# Patient Record
Sex: Female | Born: 1960 | Race: White | Hispanic: No | Marital: Married | Smoking: Former smoker
Health system: Southern US, Community
[De-identification: ages and names within clinical notes are randomized; demographics above are authoritative.]

## PROBLEM LIST (undated history)

## (undated) DIAGNOSIS — E785 Hyperlipidemia, unspecified: Secondary | ICD-10-CM

## (undated) DIAGNOSIS — Z8739 Personal history of other diseases of the musculoskeletal system and connective tissue: Secondary | ICD-10-CM

## (undated) DIAGNOSIS — M797 Fibromyalgia: Secondary | ICD-10-CM

## (undated) DIAGNOSIS — F419 Anxiety disorder, unspecified: Secondary | ICD-10-CM

## (undated) DIAGNOSIS — G4730 Sleep apnea, unspecified: Secondary | ICD-10-CM

## (undated) DIAGNOSIS — Z87442 Personal history of urinary calculi: Secondary | ICD-10-CM

## (undated) HISTORY — PX: SKIN GRAFT: SHX250

## (undated) HISTORY — PX: KNEE SURGERY: SHX244

## (undated) HISTORY — PX: BREAST REDUCTION SURGERY: SHX8

## (undated) HISTORY — DX: Hyperlipidemia, unspecified: E78.5

## (undated) HISTORY — PX: ABDOMINAL HYSTERECTOMY: SHX81

## (undated) HISTORY — PX: TONSILLECTOMY: SUR1361

## (undated) HISTORY — DX: Fibromyalgia: M79.7

---

## 2006-05-23 ENCOUNTER — Ambulatory Visit: Payer: Self-pay | Admitting: Cardiology

## 2015-08-30 DIAGNOSIS — Z720 Tobacco use: Secondary | ICD-10-CM | POA: Diagnosis not present

## 2015-08-30 DIAGNOSIS — F329 Major depressive disorder, single episode, unspecified: Secondary | ICD-10-CM | POA: Diagnosis not present

## 2015-08-30 DIAGNOSIS — L821 Other seborrheic keratosis: Secondary | ICD-10-CM | POA: Diagnosis not present

## 2015-08-30 DIAGNOSIS — L820 Inflamed seborrheic keratosis: Secondary | ICD-10-CM | POA: Diagnosis not present

## 2015-08-30 DIAGNOSIS — M545 Low back pain: Secondary | ICD-10-CM | POA: Diagnosis not present

## 2015-08-30 DIAGNOSIS — Z6822 Body mass index (BMI) 22.0-22.9, adult: Secondary | ICD-10-CM | POA: Diagnosis not present

## 2015-08-30 DIAGNOSIS — K219 Gastro-esophageal reflux disease without esophagitis: Secondary | ICD-10-CM | POA: Diagnosis not present

## 2015-10-30 DIAGNOSIS — G8929 Other chronic pain: Secondary | ICD-10-CM | POA: Diagnosis not present

## 2015-10-30 DIAGNOSIS — E7800 Pure hypercholesterolemia, unspecified: Secondary | ICD-10-CM | POA: Diagnosis not present

## 2015-11-30 DIAGNOSIS — M159 Polyosteoarthritis, unspecified: Secondary | ICD-10-CM | POA: Diagnosis not present

## 2015-11-30 DIAGNOSIS — E7800 Pure hypercholesterolemia, unspecified: Secondary | ICD-10-CM | POA: Diagnosis not present

## 2015-12-20 DIAGNOSIS — E7800 Pure hypercholesterolemia, unspecified: Secondary | ICD-10-CM | POA: Diagnosis not present

## 2015-12-20 DIAGNOSIS — M159 Polyosteoarthritis, unspecified: Secondary | ICD-10-CM | POA: Diagnosis not present

## 2015-12-29 DIAGNOSIS — M797 Fibromyalgia: Secondary | ICD-10-CM | POA: Diagnosis not present

## 2016-01-17 DIAGNOSIS — D3132 Benign neoplasm of left choroid: Secondary | ICD-10-CM | POA: Diagnosis not present

## 2016-01-17 DIAGNOSIS — H524 Presbyopia: Secondary | ICD-10-CM | POA: Diagnosis not present

## 2016-01-25 DIAGNOSIS — L820 Inflamed seborrheic keratosis: Secondary | ICD-10-CM | POA: Diagnosis not present

## 2016-01-25 DIAGNOSIS — Z1283 Encounter for screening for malignant neoplasm of skin: Secondary | ICD-10-CM | POA: Diagnosis not present

## 2016-01-25 DIAGNOSIS — L723 Sebaceous cyst: Secondary | ICD-10-CM | POA: Diagnosis not present

## 2016-01-25 DIAGNOSIS — X32XXXA Exposure to sunlight, initial encounter: Secondary | ICD-10-CM | POA: Diagnosis not present

## 2016-01-25 DIAGNOSIS — L570 Actinic keratosis: Secondary | ICD-10-CM | POA: Diagnosis not present

## 2016-01-29 DIAGNOSIS — F329 Major depressive disorder, single episode, unspecified: Secondary | ICD-10-CM | POA: Diagnosis not present

## 2016-01-29 DIAGNOSIS — Z1389 Encounter for screening for other disorder: Secondary | ICD-10-CM | POA: Diagnosis not present

## 2016-01-29 DIAGNOSIS — Z79899 Other long term (current) drug therapy: Secondary | ICD-10-CM | POA: Diagnosis not present

## 2016-01-29 DIAGNOSIS — Z6823 Body mass index (BMI) 23.0-23.9, adult: Secondary | ICD-10-CM | POA: Diagnosis not present

## 2016-01-29 DIAGNOSIS — E7800 Pure hypercholesterolemia, unspecified: Secondary | ICD-10-CM | POA: Diagnosis not present

## 2016-01-29 DIAGNOSIS — Z1211 Encounter for screening for malignant neoplasm of colon: Secondary | ICD-10-CM | POA: Diagnosis not present

## 2016-01-29 DIAGNOSIS — Z0000 Encounter for general adult medical examination without abnormal findings: Secondary | ICD-10-CM | POA: Diagnosis not present

## 2016-01-29 DIAGNOSIS — R5383 Other fatigue: Secondary | ICD-10-CM | POA: Diagnosis not present

## 2016-01-29 DIAGNOSIS — E559 Vitamin D deficiency, unspecified: Secondary | ICD-10-CM | POA: Diagnosis not present

## 2016-01-29 DIAGNOSIS — Z299 Encounter for prophylactic measures, unspecified: Secondary | ICD-10-CM | POA: Diagnosis not present

## 2016-01-29 DIAGNOSIS — Z7189 Other specified counseling: Secondary | ICD-10-CM | POA: Diagnosis not present

## 2016-02-02 DIAGNOSIS — H5032 Intermittent alternating esotropia: Secondary | ICD-10-CM | POA: Diagnosis not present

## 2016-02-09 DIAGNOSIS — M159 Polyosteoarthritis, unspecified: Secondary | ICD-10-CM | POA: Diagnosis not present

## 2016-02-09 DIAGNOSIS — E7800 Pure hypercholesterolemia, unspecified: Secondary | ICD-10-CM | POA: Diagnosis not present

## 2016-03-21 DIAGNOSIS — M159 Polyosteoarthritis, unspecified: Secondary | ICD-10-CM | POA: Diagnosis not present

## 2016-03-21 DIAGNOSIS — E7800 Pure hypercholesterolemia, unspecified: Secondary | ICD-10-CM | POA: Diagnosis not present

## 2016-03-25 DIAGNOSIS — H00024 Hordeolum internum left upper eyelid: Secondary | ICD-10-CM | POA: Diagnosis not present

## 2016-03-29 DIAGNOSIS — M797 Fibromyalgia: Secondary | ICD-10-CM | POA: Diagnosis not present

## 2016-03-29 DIAGNOSIS — F329 Major depressive disorder, single episode, unspecified: Secondary | ICD-10-CM | POA: Diagnosis not present

## 2016-03-29 DIAGNOSIS — E7800 Pure hypercholesterolemia, unspecified: Secondary | ICD-10-CM | POA: Diagnosis not present

## 2016-03-29 DIAGNOSIS — R42 Dizziness and giddiness: Secondary | ICD-10-CM | POA: Diagnosis not present

## 2016-04-03 DIAGNOSIS — G9389 Other specified disorders of brain: Secondary | ICD-10-CM | POA: Diagnosis not present

## 2016-04-03 DIAGNOSIS — R42 Dizziness and giddiness: Secondary | ICD-10-CM | POA: Diagnosis not present

## 2016-04-11 DIAGNOSIS — R195 Other fecal abnormalities: Secondary | ICD-10-CM | POA: Diagnosis not present

## 2016-04-16 DIAGNOSIS — K635 Polyp of colon: Secondary | ICD-10-CM | POA: Diagnosis not present

## 2016-04-16 DIAGNOSIS — Z8249 Family history of ischemic heart disease and other diseases of the circulatory system: Secondary | ICD-10-CM | POA: Diagnosis not present

## 2016-04-16 DIAGNOSIS — K219 Gastro-esophageal reflux disease without esophagitis: Secondary | ICD-10-CM | POA: Diagnosis not present

## 2016-04-16 DIAGNOSIS — R195 Other fecal abnormalities: Secondary | ICD-10-CM | POA: Diagnosis not present

## 2016-04-16 DIAGNOSIS — Z79899 Other long term (current) drug therapy: Secondary | ICD-10-CM | POA: Diagnosis not present

## 2016-04-16 DIAGNOSIS — E7800 Pure hypercholesterolemia, unspecified: Secondary | ICD-10-CM | POA: Diagnosis not present

## 2016-04-16 DIAGNOSIS — D120 Benign neoplasm of cecum: Secondary | ICD-10-CM | POA: Diagnosis not present

## 2016-04-16 DIAGNOSIS — M797 Fibromyalgia: Secondary | ICD-10-CM | POA: Diagnosis not present

## 2016-04-16 DIAGNOSIS — D125 Benign neoplasm of sigmoid colon: Secondary | ICD-10-CM | POA: Diagnosis not present

## 2016-04-16 DIAGNOSIS — Z833 Family history of diabetes mellitus: Secondary | ICD-10-CM | POA: Diagnosis not present

## 2016-04-16 DIAGNOSIS — Z807 Family history of other malignant neoplasms of lymphoid, hematopoietic and related tissues: Secondary | ICD-10-CM | POA: Diagnosis not present

## 2016-04-18 DIAGNOSIS — E7800 Pure hypercholesterolemia, unspecified: Secondary | ICD-10-CM | POA: Diagnosis not present

## 2016-04-18 DIAGNOSIS — M159 Polyosteoarthritis, unspecified: Secondary | ICD-10-CM | POA: Diagnosis not present

## 2016-04-26 DIAGNOSIS — Z6823 Body mass index (BMI) 23.0-23.9, adult: Secondary | ICD-10-CM | POA: Diagnosis not present

## 2016-04-26 DIAGNOSIS — E7800 Pure hypercholesterolemia, unspecified: Secondary | ICD-10-CM | POA: Diagnosis not present

## 2016-04-26 DIAGNOSIS — F329 Major depressive disorder, single episode, unspecified: Secondary | ICD-10-CM | POA: Diagnosis not present

## 2016-04-26 DIAGNOSIS — Z90710 Acquired absence of both cervix and uterus: Secondary | ICD-10-CM | POA: Diagnosis not present

## 2016-04-26 DIAGNOSIS — M797 Fibromyalgia: Secondary | ICD-10-CM | POA: Diagnosis not present

## 2016-04-30 DIAGNOSIS — K219 Gastro-esophageal reflux disease without esophagitis: Secondary | ICD-10-CM | POA: Diagnosis not present

## 2016-04-30 DIAGNOSIS — M797 Fibromyalgia: Secondary | ICD-10-CM | POA: Diagnosis not present

## 2016-04-30 DIAGNOSIS — Z9049 Acquired absence of other specified parts of digestive tract: Secondary | ICD-10-CM | POA: Diagnosis not present

## 2016-04-30 DIAGNOSIS — R51 Headache: Secondary | ICD-10-CM | POA: Diagnosis not present

## 2016-04-30 DIAGNOSIS — Z79891 Long term (current) use of opiate analgesic: Secondary | ICD-10-CM | POA: Diagnosis not present

## 2016-04-30 DIAGNOSIS — Z90710 Acquired absence of both cervix and uterus: Secondary | ICD-10-CM | POA: Diagnosis not present

## 2016-04-30 DIAGNOSIS — Z0389 Encounter for observation for other suspected diseases and conditions ruled out: Secondary | ICD-10-CM | POA: Diagnosis not present

## 2016-04-30 DIAGNOSIS — Z9889 Other specified postprocedural states: Secondary | ICD-10-CM | POA: Diagnosis not present

## 2016-04-30 DIAGNOSIS — R112 Nausea with vomiting, unspecified: Secondary | ICD-10-CM | POA: Diagnosis not present

## 2016-04-30 DIAGNOSIS — Z79899 Other long term (current) drug therapy: Secondary | ICD-10-CM | POA: Diagnosis not present

## 2016-05-28 ENCOUNTER — Other Ambulatory Visit (INDEPENDENT_AMBULATORY_CARE_PROVIDER_SITE_OTHER): Payer: Medicare Other

## 2016-05-28 ENCOUNTER — Ambulatory Visit: Payer: Self-pay | Admitting: Neurology

## 2016-05-28 ENCOUNTER — Ambulatory Visit (INDEPENDENT_AMBULATORY_CARE_PROVIDER_SITE_OTHER): Payer: Medicare Other | Admitting: Neurology

## 2016-05-28 ENCOUNTER — Encounter: Payer: Self-pay | Admitting: Neurology

## 2016-05-28 VITALS — BP 130/76 | HR 76 | Ht 64.5 in | Wt 137.0 lb

## 2016-05-28 DIAGNOSIS — E611 Iron deficiency: Secondary | ICD-10-CM | POA: Diagnosis not present

## 2016-05-28 DIAGNOSIS — Z299 Encounter for prophylactic measures, unspecified: Secondary | ICD-10-CM | POA: Diagnosis not present

## 2016-05-28 DIAGNOSIS — R413 Other amnesia: Secondary | ICD-10-CM | POA: Diagnosis not present

## 2016-05-28 DIAGNOSIS — M797 Fibromyalgia: Secondary | ICD-10-CM | POA: Diagnosis not present

## 2016-05-28 DIAGNOSIS — Z6823 Body mass index (BMI) 23.0-23.9, adult: Secondary | ICD-10-CM | POA: Diagnosis not present

## 2016-05-28 DIAGNOSIS — R930 Abnormal findings on diagnostic imaging of skull and head, not elsewhere classified: Secondary | ICD-10-CM | POA: Diagnosis not present

## 2016-05-28 DIAGNOSIS — F329 Major depressive disorder, single episode, unspecified: Secondary | ICD-10-CM | POA: Diagnosis not present

## 2016-05-28 DIAGNOSIS — R51 Headache: Secondary | ICD-10-CM | POA: Diagnosis not present

## 2016-05-28 DIAGNOSIS — R9082 White matter disease, unspecified: Secondary | ICD-10-CM

## 2016-05-28 DIAGNOSIS — R519 Headache, unspecified: Secondary | ICD-10-CM

## 2016-05-28 LAB — VITAMIN B12: Vitamin B-12: 354 pg/mL (ref 211–911)

## 2016-05-28 NOTE — Progress Notes (Addendum)
NEUROLOGY CONSULTATION NOTE  Bethsy Scarantino MRN: [id number] DOB: [birth year]  Referring provider: Dr. Manuella Ghazi Primary care provider: Dr. Manuella Ghazi  Reason for consult:  Evaluate for MS  HISTORY OF PRESENT ILLNESS: Haley Caldwell is a 55 year old right-handed woman with depression, fibromyalgia, IBS, hyperlipidemia and former smoker who presents for multiple sclerosis.  History obtained by patient and PCP note.  She has noted problems with memory and change in personality over the past 2 years.  She has some short term memory problems and needs to mark down reminders to keep appointments and pay bills.  One time, while driving on a familiar route, she briefly became disoriented but was quickly able to re-orient herself.  Otherwise she has not gotten lost while driving on familiar routes.  Her husband had an employee whom she apparently disliked.  When looking over her husband's papers, she saw that person's name and was unable to recall who she was.  She also reports change in personality.  She was always a social person and now has social anxiety.  She has problems with problem-solving skills, which was one of her strong suits.    She feels off balance as well.  She has history of headaches but began having a new headache several months ago.  It is a severe pain behind her left ear.  It lasts about 10 minutes and associated with nausea and vomiting.  It occurs once every 2 weeks.  She denies neck pain.  She also reports episodic horizontal double vision.  It looks like her left eye moves outwards when it occurs.  She was seen by ophthalmology.  I don't have the notes, but it sounds like they may have diagnosed her with amblyopia.  Prisms were ineffective and they suggested surgery to correct it.  Her husband passed away a year ago, but she reports memory problems started 2 years ago.  She denies history of head trauma.  She denies family history of headache or dementia.  Her maternal grandfather had  brain cancer.  To further evaluate, she had an MRI of the brain without contrast performed on 04/03/16.  I do not have the imaging, but the report revealed a nonspecific 5 mm left occipital periventricular white matter hyperintensity, which they said may be chronic ischemia or MS.  TSH from June was 0.463 and vitamin D was 59.  PAST MEDICAL HISTORY: Past Medical History:  Diagnosis Date  . Fibromyalgia   . Hyperlipemia     PAST SURGICAL HISTORY: Past Surgical History:  Procedure Laterality Date  . BREAST REDUCTION SURGERY    . TONSILLECTOMY      MEDICATIONS: No current outpatient prescriptions on file prior to visit.   No current facility-administered medications on file prior to visit.     ALLERGIES: No Known Allergies  FAMILY HISTORY: Family History  Problem Relation Age of Onset  . Brain cancer Paternal Grandfather     SOCIAL HISTORY: Social History   Social History  . Marital status: Unknown    Spouse name: N/A  . Number of children: N/A  . Years of education: N/A   Occupational History  . Not on file.   Social History Main Topics  . Smoking status: Former Research scientist (life sciences)  . Smokeless tobacco: Not on file  . Alcohol use Not on file  . Drug use: Unknown  . Sexual activity: Not on file   Other Topics Concern  . Not on file   Social History Narrative  . No narrative on file  REVIEW OF SYSTEMS: Constitutional: No fevers, chills, or sweats, no generalized fatigue, change in appetite Eyes: No visual changes, double vision, eye pain Ear, nose and throat: No hearing loss, ear pain, nasal congestion, sore throat Cardiovascular: No chest pain, palpitations Respiratory:  No shortness of breath at rest or with exertion, wheezes GastrointestinaI: No nausea, vomiting, diarrhea, abdominal pain, fecal incontinence Genitourinary:  No dysuria, urinary retention or frequency Musculoskeletal:  No neck pain, back pain Integumentary: No rash, pruritus, skin  lesions Neurological: as above Psychiatric: No depression, insomnia, anxiety Endocrine: No palpitations, fatigue, diaphoresis, mood swings, change in appetite, change in weight, increased thirst Hematologic/Lymphatic:  No purpura, petechiae. Allergic/Immunologic: no itchy/runny eyes, nasal congestion, recent allergic reactions, rashes  PHYSICAL EXAM: Vitals:   05/28/16 0750  BP: 130/76  Pulse: 76   General: No acute distress.  Patient appears well-groomed.  Head:  Normocephalic/atraumatic Eyes:  fundi examined but not visualized Neck: supple, no paraspinal tenderness, full range of motion Back: No paraspinal tenderness Heart: regular rate and rhythm Lungs: Clear to auscultation bilaterally. Vascular: No carotid bruits. Neurological Exam: Mental status: alert and oriented to person, place, and time, recent and remote memory intact, fund of knowledge intact, attention and concentration intact, speech fluent and not dysarthric, language intact. MMSE - Mini Mental State Exam 05/28/2016  Orientation to time 5  Orientation to Place 5  Registration 3  Attention/ Calculation 5  Recall 2  Language- name 2 objects 2  Language- repeat 1  Language- follow 3 step command 3  Language- read & follow direction 1  Write a sentence 1  Copy design 1  Total score 29   Cranial nerves: CN I: not tested CN II: pupils equal, round and reactive to light, visual fields intact CN III, IV, VI:  full range of motion, no nystagmus, no ptosis CN V: facial sensation intact CN VII: upper and lower face symmetric CN VIII: hearing intact CN IX, X: gag intact, uvula midline CN XI: sternocleidomastoid and trapezius muscles intact CN XII: tongue midline Bulk & Tone: normal, no fasciculations. Motor:  5/5 throughout  Sensation: temperature and vibration sensation intact. Deep Tendon Reflexes:  2+ throughout, toes downgoing.  Finger to nose testing:  Without dysmetria.  Heel to shin:  Without dysmetria.   Gait:  Normal station and stride.  Able to turn and tandem walk. Romberg negative.  IMPRESSION: 1.  Abnormal white matter change in MRI of brain.  It is of uncertain clinical significance.  It may represent small area of chronic ischemia in someone with hyperlipidemia and history of smoking.  Based on report, it is a nonspecific finding and in itself is not diagnostic for multiple sclerosis.   2.  Intermittent diplopia.  It is being treated by ophthalmology.  There is nothing on brain MRI to explain these symptoms. 3.  Memory deficits and personality change.   4.  Headache.   PLAN: 1.  I asked her to bring the MRI of the brain for me to personally review. 2.  We will check B12 level.  If unremarkable, will get neuropsychological testing 3.  She is not interested in starting headache preventative at this time. 4.  Follow up after testing.  Thank you for allowing me to take part in the care of this patient.  Metta Clines, DO  CC:  Monico Blitz, MD

## 2016-05-28 NOTE — Patient Instructions (Signed)
Based on description of MRI, it is not diagnostic for MS.  Sometimes, people may have a spot or two on the brain of unknown clinical significance. 1.  For memory, we will check B12.  If it is very low, then we will treat (low B12 may cause memory problems).  If it is unremarkable, we will set you up for neuropsychological testing.  Follow up afterwards. 2.  Try to bring me the disc of your brain MRI so I can review it myself.

## 2016-05-29 NOTE — Progress Notes (Signed)
Chart forwarded.  

## 2016-05-30 ENCOUNTER — Telehealth: Payer: Self-pay

## 2016-05-30 NOTE — Telephone Encounter (Signed)
Message sent to scheduling staff to contact pt to schedule.

## 2016-05-30 NOTE — Telephone Encounter (Signed)
-----   Message from Pieter Partridge, DO sent at 05/30/2016  7:07 AM EDT ----- B12 looks okay.  I would like to get neuropsychological testing

## 2016-05-30 NOTE — Telephone Encounter (Signed)
Message relayed to patient. Verbalized understanding and denied questions.   

## 2016-06-12 DIAGNOSIS — M159 Polyosteoarthritis, unspecified: Secondary | ICD-10-CM | POA: Diagnosis not present

## 2016-06-12 DIAGNOSIS — E7800 Pure hypercholesterolemia, unspecified: Secondary | ICD-10-CM | POA: Diagnosis not present

## 2016-06-24 DIAGNOSIS — Z713 Dietary counseling and surveillance: Secondary | ICD-10-CM | POA: Diagnosis not present

## 2016-06-24 DIAGNOSIS — Z299 Encounter for prophylactic measures, unspecified: Secondary | ICD-10-CM | POA: Diagnosis not present

## 2016-06-24 DIAGNOSIS — G8929 Other chronic pain: Secondary | ICD-10-CM | POA: Diagnosis not present

## 2016-06-24 DIAGNOSIS — Z6823 Body mass index (BMI) 23.0-23.9, adult: Secondary | ICD-10-CM | POA: Diagnosis not present

## 2016-07-02 ENCOUNTER — Ambulatory Visit (INDEPENDENT_AMBULATORY_CARE_PROVIDER_SITE_OTHER): Payer: Medicare Other | Admitting: Psychology

## 2016-07-02 ENCOUNTER — Encounter: Payer: Self-pay | Admitting: Psychology

## 2016-07-02 DIAGNOSIS — R413 Other amnesia: Secondary | ICD-10-CM | POA: Diagnosis not present

## 2016-07-02 DIAGNOSIS — R930 Abnormal findings on diagnostic imaging of skull and head, not elsewhere classified: Secondary | ICD-10-CM

## 2016-07-02 DIAGNOSIS — R9082 White matter disease, unspecified: Secondary | ICD-10-CM

## 2016-07-02 NOTE — Progress Notes (Signed)
NEUROPSYCHOLOGICAL INTERVIEW (CPT: [id number])  Name: Haley Caldwell Date of Birth: 16-Dec-1960 Date of Interview: 07/02/2016  Reason for Referral:  Haley Caldwell is a 55 y.o., right-handed, widowed female who is referred for neuropsychological evaluation by Dr. Metta Clines of [facility] due to concerns about cognitive changes. This patient is unaccompanied in the office today.   History of Presenting Problem:  Ms. Haley Caldwell reported cognitive changes over at least the past two years. She is unsure if onset was sudden or gradual, and she is unsure if course is stable or worsening. She reported that her daughter has also noticed memory difficulties in the patient and when she recently had a more significant memory lapse, she encouraged the patient to be seen by a medical provider. Apparently the patient was going through her husband's office after he passed away, and she came across the name and photo of a former employee who she did not remember. She asked her daughter about it and was told that it was someone the patient really did not like. Even with cueing, the patient could not remember the person. The person had worked for her husband a few years ago. Ms. Haley Caldwell reported she has also had poor memory for more remote events, like things from her daughter's childhood. She has also had more trouble remembering recent conversations. Ms. Haley Caldwell reported that she has "lost time" twice recently -- ie, lost track of time and does not know where the time went. She does not get lost when driving but on one occasion she was driving to her daughter's house and she suddenly did not know what road she was on. She kept driving until she saw a landmark she recognized. Ms. Haley Caldwell also reported that at some point, she forgot which bottle in the shower was her shampoo and which was her conditioner, even though the bottles look different. She had to read the bottles each day in order to know; she could not memorize which  was which. She finally got it memorized but then she went away for the weekend and she forgot again. She also noted that she used to be a very good problem-solver but this is now a challenge for her. Upon direct questioning, the patient also reports misplacing/losing items, some forgetfulness for nighttime medications, difficulty concentrating, distractibility, starting but not finishing tasks, slowed information processing speed, word finding difficulty, spelling difficulty, and comprehension difficulty. She has not missed appointments or other obligations, but she notes that she has to put everything in her phone and rely on reminders.  Ms. Haley Caldwell reported depressed mood since her husband's unexpected death a year ago. Over the past year, she has spent most of her time in bed due to her depression. 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 She denied suicidal ideation but has felt that if she died it would be a relief. She has not had any grief counseling; her church offers group grief counseling but she is afraid she would take on the pain of the other participants as she is a very empathic person. She has also had no appetite since her husband died, and she has lost a significant amount of weight. She usually eats one meal a day now. She sleeps pretty well although she does wake up a lot to urinate (she drinks a lot of water). She does not have incontinence. She denies hallucinations. She denies personality change. When asked about social  support, she denied any other support aside from her daughter. She denied any current stressors including financial or family stress.  The patient reported that her memory problems preceded her husband's unexpected death by at least a year, so she does not think his death is related in any way. She did note that about two years ago (so around the time she began noticing cognitive problems), she started  experiencing social anxiety. She cannot identify any contributing factors to onset of this problem. She would feel very nervous in social situations and relied on her husband as a "buffer". She continues to be very nervous in social situations. She stated she does not want to speak because she does not want to seem stupid if she has word finding difficulty.  Previous psychiatric history was denied.  She denied history of head injury, seizure or loss of consciousness.   There is no known family history of dementia.  Ms. Haley Caldwell saw Dr. Tomi Likens for neurologic consultation on 05/28/2016. MMSE was 29/30 on that date. She had a previous MRI of the brain which apparently showed abnormal white matter change, of uncertain clinical significance. Dr. Tomi Likens will be reviewing the films when they become available.  (MRI of the brain without contrast performed on 04/03/16.  Report revealed a nonspecific 5 mm left occipital periventricular white matter hyperintensity, which they said may be chronic ischemia or MS.)   Current Functioning: Ms. Haley Caldwell lives alone in her own condo. 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 She is enjoying this. She also enjoys spending time with her daughter and grandchildren. The patient manages all complex ADLs independently. She had to learn to manage the finances after her husband passed away as he had always taken care of this. She has not had any problems doing this. She does use reminders for bills.  Physically, the patient reported she feels "run down". She has chronic pain associated with fibromyalgia. She also had new onset of headaches prior to seeing Dr. Tomi Likens but these has mostly subsided.    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 3 children, 7 grandchildren. Alcohol/Tobacco/Substances: No alcohol  use. Used to smoke tobacco, haven't smoked in 20 years. Do vape or use e-cig (in last couple of years). No substance abuse.   Medical History: Past Medical History:  Diagnosis Date  . Fibromyalgia   . Hyperlipemia   Degenerative disc disease Nicotine dependence (vape and e-cig) Depression/Bereavement   Current Medications:  Outpatient Encounter Prescriptions as of 07/02/2016  Medication Sig  . acyclovir (ZOVIRAX) 200 MG capsule   . carisoprodol (SOMA) 350 MG tablet Take 350 mg by mouth 4 (four) times daily as needed for muscle spasms.  Marland Kitchen esomeprazole (NEXIUM) 40 MG capsule   . pravastatin (PRAVACHOL) 40 MG tablet    No facility-administered encounter medications on file as of 07/02/2016.    The patient also reported that she takes Hydrocodone 1/2 pill 4x/day (7.5mg ). She reported she has been taking this for about 20 years. She reported she takes the [place] only once a day.  Behavioral Observations:   Appearance: Neat, casually and appropriately dressed and groomed Gait: Ambulated independently, no abnormalities observed Speech: Fluent; normal rate, rhythm and volume Thought process: Linear, goal directed Affect: Blunted Interpersonal: Pleasant, appropriate   TESTING: There is medical necessity to proceed with neuropsychological assessment as the results will be used to aid  in differential diagnosis and clinical decision-making and to inform specific treatment recommendations. Per the patient and medical records reviewed, there has been a change in cognitive functioning and a reasonable suspicion of neurocognitive disorder.   PLAN: The patient will return for a full battery of neuropsychological testing with a psychometrician under my supervision. Education regarding testing procedures was provided. Subsequently, the patient will see this provider for a follow-up session at which time her test performances and my impressions and treatment recommendations will be reviewed in  detail.   Full neuropsychological evaluation report to follow.

## 2016-07-09 ENCOUNTER — Ambulatory Visit (INDEPENDENT_AMBULATORY_CARE_PROVIDER_SITE_OTHER): Payer: Medicare Other | Admitting: Psychology

## 2016-07-09 DIAGNOSIS — R413 Other amnesia: Secondary | ICD-10-CM | POA: Diagnosis not present

## 2016-07-09 DIAGNOSIS — R9082 White matter disease, unspecified: Secondary | ICD-10-CM

## 2016-07-09 DIAGNOSIS — R930 Abnormal findings on diagnostic imaging of skull and head, not elsewhere classified: Secondary | ICD-10-CM

## 2016-07-09 NOTE — Progress Notes (Signed)
   Neuropsychology Note  Haley Caldwell returned today for 2 hours of neuropsychological testing with technician, Milana Kidney, BS, under the supervision of Dr. Macarthur Critchley. The patient did not appear overtly distressed by the testing session, per behavioral observation or via self-report to the technician. Rest breaks were offered. Haley Caldwell will return within 2 weeks for a feedback session with Dr. Si Raider at which time her test performances, clinical impressions and treatment recommendations will be reviewed in detail. The patient understands she can contact our office should she require our assistance before this time.  Full report to follow.

## 2016-07-19 DIAGNOSIS — E7800 Pure hypercholesterolemia, unspecified: Secondary | ICD-10-CM | POA: Diagnosis not present

## 2016-07-19 DIAGNOSIS — M159 Polyosteoarthritis, unspecified: Secondary | ICD-10-CM | POA: Diagnosis not present

## 2016-07-20 NOTE — Progress Notes (Signed)
NEUROPSYCHOLOGICAL EVALUATION   Name:    Haley Caldwell  Date of Birth:   April 12, 1961 Date of Interview:  07/02/2016 Date of Testing:  07/09/2016   Date of Feedback:  07/22/2016      Background Information:  Reason for Referral:  Haley Caldwell is a 55 y.o. female referred by Dr. Metta Clines to assess her current level of cognitive functioning and assist in differential diagnosis. The current evaluation consisted of a review of available medical records, an interview with the patient, and the completion of a neuropsychological testing battery. Informed consent was obtained.  History of Presenting Problem:  Haley Caldwell reported cognitive changes over at least the past two years. She is unsure if onset was sudden or gradual, and she is unsure if course is stable or worsening. She reported that her daughter has also noticed memory difficulties in the patient and when she recently had a more significant memory lapse, she encouraged the patient to be seen by a medical provider. Apparently the patient was going through her husband's office after he passed away, and she came across the name and photo of a former employee who she did not remember. She asked her daughter about it and was told that it was someone the patient really did not like. Even with cueing, the patient could not remember the person. The person had worked for her husband a few years ago. Haley Caldwell reported she has also had poor memory for more remote events, like things from her daughter's childhood. She has also had more trouble remembering recent conversations. Haley Caldwell reported that she has "lost time" twice recently -- ie, lost track of time and does not know where the time went. She does not get lost when driving but on one occasion she was driving to her daughter's house and she suddenly did not know what road she was on. She kept driving until she saw a landmark she recognized. Haley Caldwell also reported that at some point, she  forgot which bottle in the shower was her shampoo and which was her conditioner, even though the bottles look different. She had to read the bottles each day in order to know; she could not memorize which was which. She finally got it memorized but then she went away for the weekend and she forgot again. She also noted that she used to be a very good problem-solver but this is now a challenge for her. Upon direct questioning, the patient also reports misplacing/losing items, some forgetfulness for nighttime medications, difficulty concentrating, distractibility, starting but not finishing tasks, slowed information processing speed, word finding difficulty, spelling difficulty, and comprehension difficulty. She has not missed appointments or other obligations, but she notes that she has to put everything in her phone and rely on reminders.  Haley Caldwell reported depressed mood since her husband's unexpected death a year ago. Over the past year, she has spent most of her time in bed due to her depression. 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 She denied suicidal ideation but has felt that if she died it would be a relief. She has not had any grief counseling; her church offers group grief counseling but she is afraid she would take on the pain of the other participants as she is a very empathic person. She has also had no appetite since her husband died, and she has lost a significant amount of weight. She  usually eats one meal a day now. She sleeps pretty well although she does wake up a lot to urinate (she drinks a lot of water). She does not have incontinence. She denies hallucinations. She denies personality change. When asked about social support, she denied any other support aside from her daughter. She denied any current stressors including financial or family stress.  The patient reported that her memory problems preceded her husband's  unexpected death by at least a year, so she does not think his death is related in any way. She did note that about two years ago (so around the time she began noticing cognitive problems), she started experiencing social anxiety. She cannot identify any contributing factors to onset of this problem. She would feel very nervous in social situations and relied on her husband as a "buffer". She continues to be very nervous in social situations. She stated she does not want to speak because she does not want to seem stupid if she has word finding difficulty.  Previous psychiatric history was denied.  She denied history of head injury, seizure or loss of consciousness.   There is no known family history of dementia.  Haley Caldwell saw Dr. Tomi Likens for neurologic consultation on 05/28/2016. MMSE was 29/30 on that date. She had a previous MRI of the brain which apparently showed abnormal white matter change, of uncertain clinical significance. Dr. Tomi Likens will be reviewing the films when they become available.  (MRI of the brain without contrast performed on 04/03/16. Report revealed a nonspecific 5 mm left occipital periventricular white matter hyperintensity, which they said may be chronic ischemia or MS.)   Current Functioning: Haley Caldwell lives alone in her own condo. 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 She is enjoying this. She also enjoys spending time with her daughter and grandchildren. The patient manages all complex ADLs independently. She had to learn to manage the finances after her husband passed away as he had always taken care of this. She has not had any problems doing this. She does use reminders for bills.  Physically, the patient reported she feels "run down". She has chronic pain associated with fibromyalgia. She also had new onset of headaches prior to seeing Dr. Tomi Likens but these has mostly subsided.    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 3 children, 7 grandchildren. Alcohol/Tobacco/Substances: No alcohol use. Used to smoke tobacco, has not smoked in 20 years. She does vape or use e-cig (in last couple of years). No substance abuse.   Medical History:  Past Medical History:  Diagnosis Date  . Fibromyalgia   . Hyperlipemia   Degenerative disc disease Nicotine dependence (vape and e-cig) Depression/Bereavement  Current medications:  Outpatient Encounter Prescriptions as of 07/22/2016  Medication Sig  . acyclovir (ZOVIRAX) 200 MG capsule   . carisoprodol (SOMA) 350 MG tablet Take 350 mg by mouth 4 (four) times daily as needed for muscle spasms.  Marland Kitchen esomeprazole (NEXIUM) 40 MG capsule   . pravastatin (PRAVACHOL) 40 MG tablet    No facility-administered encounter medications on file as of 07/22/2016.    The patient also reported that she takes Hydrocodone 1/2 pill 4x/day (7.5mg ). She reported she has been taking this for about 20 years. She reported she takes the [place] only once a day.  Current Examination:  Behavioral Observations:   Appearance: Neat, casually and appropriately  dressed and groomed Gait: Ambulated independently, no abnormalities observed Speech: Fluent; normal rate, rhythm and volume Thought process: Linear, goal directed Affect: Blunted Interpersonal: Pleasant, appropriate Orientation: Oriented to all spheres, accurately named the current President and his predecessor  Tests Administered: . Test of Premorbid Functioning (TOPF) . Wechsler Adult Intelligence Scale-Fourth Edition (WAIS-IV): Arithmetic, Symbol Search, Coding and Digit Span subtests . Wechsler Memory Scale-Fourth Edition (WMS-IV) Adult Version (ages 7-69): Logical Memory I, II and Recognition subtests  . [employer] Verbal Learning Test - 2nd Edition (CVLT-2) Short Form . [employer] (WCST) . Repeatable Battery for the Assessment of Neuropsychological Status (RBANS) Form A:  Figure Copy and Figure Recall Subtest . Neuropsychological Assessment Battery (NAB) Language Module, Form 1:  Auditory Comprehension and Naming Subtests . Controlled Oral Word Association Test (COWAT) . Trail Making Test A and B . Inventory of Complicated Grief (ICG) . Personality Assessment Inventory (PAI)  Test Results: Note: Standardized scores are presented only for use by appropriately trained professionals and to allow for any future test-retest comparison. These scores should not be interpreted without consideration of all the information that is contained in the rest of the report. The most recent standardization samples from the test publisher or other sources were used whenever possible to derive standard scores; scores were corrected for age, gender, ethnicity and education when available.   Test Scores:  Test Name Raw Score Standardized Score Descriptor  TOPF 52/70 SS= 109 Average  WAIS-IV Subtests     Arithmetic 18/22 ss= 13 High average  Symbol Search 40/60 ss= 14 Superior  Coding 77/135 ss= 12 High average  Digit Span 39/48 ss= 16 Very superior  WAIS-IV Index Scores     Working Memory  SS= 125 Superior  Processing Speed  SS= 117 High average  WMS-IV Subtests     LM I 28/50 ss= 11 Average  LM II 31/50 ss= 14 Superior  LM II Recognition 29/30 Cum %: >75 Above average  CVLT-II Scores     Trial 1 6/9 Z= -0.5 Average  Trial 4 9/9 Z= 1 High average  Trials 1-4 total 33/36 T= 65 Superior  SD Free Recall 8/9 Z= 0 Average  LD Free Recall 8/9 Z= 0.5 Average  LD Cued Recall 9/9 Z= 0.5 Average  Recognition Discriminability 9/9 hits, 0 false positives Z= 0.5 Average  Forced Choice Recognition 9/9  WNL  WCST     Total Errors 11 T= 55 Average  Perseverative Responses 11 T= 47 Average  Perseverative Errors 8 T= 49 Average  Conceptual Level Responses 50 T= 51 Average    Categories Completed 4 >16% WNL  Trials to Complete 1st Category 11 >16% WNL  Failure to Maintain Set 1    RBANS Subtest     Figure Copy 18/20 Z= -0.1 Average  Figure Recall 20/20 Z= 2 Very superior  NAB Language Subtests     Auditory Comprehension 89/89 T= 58 High average  Naming 31/31 T= 55 Average  COWAT-FAS 50 T= 59 High average  COWAT-Animals 25 T= 62 High average  Trail Making Test A  19" 0 errors T= 65 Superior  Trail Making Test B  42" 0 errors T= 65 Superior  ICG 28/76  Above cutoff  PAI  Only elevated clinical scale is shown here:                                  DEP  T=  53      Description of Test Results:  The patient performed well on embedded measures of effort/motivation, as well as on a test of memory malingering. As such, the following test performances are considered to be a relatively accurate reflection of the patient's current neurocogitive functioning.   Premorbid verbal intellectual abilities were estimated to have been within the average range based on a test of word reading. Psychomotor processing speed was high average. Auditory attention and working memory were superior. Visual-spatial construction was average. Language abilities were intact. Specifically, confrontation naming was average with 100% accuracy, and semantic verbal fluency was high average. Auditory comprehension was high average as well. With regard to verbal memory, encoding and acquisition of non-contextual information (i.e., word list) was superior across four learning trials. After a brief distracter task, free recall was average. After a delay, free recall was average. Cued recall was average. Performance on a yes/no recognition task was average with 100% accuracy. On another verbal memory test, encoding and acquisition of contextual auditory information (i.e., short stories) was average. After a 25 minute delay, free recall was superior. Performance on a yes/no recognition task was above  average. With regard to non-verbal memory, delayed free recall of visual information was very superior. Executive functioning was intact overall. Mental flexibility and set-shifting were superior on Trails B. Verbal fluency with phonemic search restrictions was high average. Deductive reasoning and problem solving skills were average.   On a self-report questionnaire assessing symptoms of bereavement, the patient's responses were indicative of what is considered "complicated grief" at the present time. Symptoms endorsed included: inability to accept the death of her husband, feeling that her life is now empty, longing for her deceased husband and a great deal of loneliness.   On an extensive measure of psychopathology and personality (PAI), the patient's responses yielded a valid profile but possible positive impression management was noted. The pattern of her responses suggests that she tends to portray herself as being relatively free of common shortcomings to which most individuals will admit, and she appears somewhat reluctant to recognize minor faults in herself. Although there is no evidence to suggest an effort to intentionally distort the profile, the results of the PAI may underrepresent the extent and degree of any significant findings in certain areas due to the client's tendency to avoid negative or unpleasant aspects of herself.  With respect to negative impression management, there is no evidence to suggest that the patient was motivated to portray herself in a more negative or pathological light than the clinical picture would warrant.  On the PAI, the patient reported a number of difficulties consistent with a significant depressive experience.  The quality of the patient's depression seems primarily marked by physiological features, such as a disturbance in sleep pattern, a decrease in level of energy and sexual interest, and a loss of appetite and/or weight.  However, she does not appear to  be reporting a significant degree of dysphoria or thoughts of worthlessness and hopelessness.  This pattern suggests that she might not recognize the aforementioned symptoms as signs of dysphoria and stress or may be repressing the experience of unhappiness to some extent. The patient also indicates some concerns about physical functioning and health matters in general.  She reports particular problems in physical functioning associated with sensory or motor dysfunction.  In psychiatric populations, such symptoms are often associated with conversion disorders, although they may be a result of numerous neurological conditions as well. According to the patient's self-report,  she describes NO significant problems in the following areas: unusual thoughts or peculiar experiences; antisocial behavior; problems with empathy; undue suspiciousness or hostility; extreme moodiness and impulsivity; unusually elevated mood or heightened activity; marked anxiety; problematic behaviors used to manage anxiety.  Also, she reports NO significant problems with alcohol or drug abuse or dependence.  With respect to suicidal ideation, the patient is NOT reporting distress from thoughts of self-harm.   Clinical Impressions: Major depressive disorder, single episode, mild to moderate.  Results of cognitive testing were entirely within normal limits. There were no areas of impairment, and all performances were at least average, with many scores in the high average to superior range. Meanwhile, there is evidence of significant depression that extends beyond what is considered normal bereavement. As such, the patient's subjective cognitive complaints are most likely secondary to depression and grief. I understand that the patient feels her cognitive difficulties preceded her husband's death, but I have no explanation for cognitive decline at that time, and there is no evidence of an underlying dementia or cognitive impairment at this  time. I am hopeful that more aggressive treatment of her depression and grief will result in improved quality of life as well as enhanced cognitive function in daily life.    Recommendations/Plan: Based on the findings of the present evaluation, the following recommendations are offered:   1. Treatment for depression/grief: Trial of an antidepressant medication is highly recommended, pending physician approval. I also highly recommend that she seek grief counseling. She reported that she will start attending the grief counseling program offered at her church.    2. The patient was reassured that her cognitive test results were entirely within normal limits and not indicative of a cognitive disorder or dementia at this time. These test results will serve as a nice baseline for future comparison if needed at any time.  3. Strategies to enhance cognitive functioning in daily life were reviewed with the patient and written information was provided.    Feedback to Patient: Haley Caldwell returned for a feedback appointment on 07/22/2016 to review the results of her neuropsychological evaluation with this provider. 15 minutes face-to-face time was spent reviewing her test results, my impressions and my recommendations as detailed above.    Total time spent on this patient's case: [id number] unit for interview with psychologist; [phone] units of testing by psychometrician under psychologist's supervision; [phone] units for medical record review, scoring of neuropsychological tests, interpretation of test results, preparation of this report, and review of results to the patient by psychologist.      Thank you for your referral of Haley Caldwell. Please feel free to contact me if you have any questions or concerns regarding this report.

## 2016-07-22 ENCOUNTER — Ambulatory Visit (INDEPENDENT_AMBULATORY_CARE_PROVIDER_SITE_OTHER): Payer: Medicare Other | Admitting: Psychology

## 2016-07-22 ENCOUNTER — Encounter: Payer: Self-pay | Admitting: Psychology

## 2016-07-22 DIAGNOSIS — R413 Other amnesia: Secondary | ICD-10-CM | POA: Diagnosis not present

## 2016-07-22 DIAGNOSIS — F320 Major depressive disorder, single episode, mild: Secondary | ICD-10-CM

## 2016-07-22 NOTE — Patient Instructions (Signed)
Fortunately, results of cognitive testing were entirely within normal limits and NOT indicative of a neurocognitive disorder.  Psychological testing demonstrated depression.  It is most likely the case that your cognitive symptoms in daily life are due to depression and grief.  Below is some more information on this. I am hopeful that treatment of depression will not only improve your mood and coping, but also result in improved cognitive functioning in your daily life.    The effect of depression and anxiety on your cognitive functioning: . One of the typical symptoms of depression is difficulty concentrating and making decisions, and various types of anxiety also interfere with attention and concentration . Problems with attention and concentration can disrupt the process of learning and making new memories, which can make it seem like there is a problem with your memory. In your daily life, you may experience this disruption as forgetting names and appointments, misplacing items, and needing to make lists for shopping and errands. It may be harder for you to stay focused on tasks and feel as "sharp" as you did in the past.  . Also, when we are depressed or anxious, we often pay more attention to our difficulties (rather than our strengths) in our daily life, and this can make it seem to [place] like we are doing worse cognitively than we really are. . The cognitive aspects of depression and anxiety are sometimes observed as an identifiable pattern of poor performance on a neuropsychological evaluation, but it is also possible that all scores on an evaluation are within normal limits. . Regardless of the test scores, distress related to depression and anxiety can interfere with the ability to make use of your cognitive resources and function optimally across settings such as work or school, maintaining the home and responsibilities, and personal relationships. . Fortunately, there are treatments for  depression and anxiety, and when mood improves, cognitive functioning in daily life often improves. . Treatment options include psychotherapy, medications (e.g., antidepressants), and behavioral changes, such as increasing your involvement in enjoyable activities, increasing the amount of exercise you are getting, and maintaining a regular routine.    Strategies to enhance cognitive functioning Attention, concentration, memory encoding and consolidation    . Make a plan and be prepared o If you find that you are more attentive at certain times of the day (i.e., the morning), plan important activities and discussions at that time o Determine which activities take the most time and which are most important, then prioritize your "to do list" based on this information o Break tasks into simpler parts, understand the steps involved before starting o Rehearse the steps mentally or write them down. If you write them down, you can use this as a checklist to check off as you complete them. o Visualize completing the task  . Use external aids  o Write everything down that you do not need to know or work with right now. Don't store extra information in your brain that you don't need right now.  o Use a calendar or planner to make checklists, due dates and "to do" lists. o Use ONLY ONE calendar or planner and look at it frequently o Set alarms for important deadlines or appointments  . Minimize interruptions and distractions  o Find a good work environment, e.g., quiet room with a desk, close curtains, use earplugs, mask sounds with a fan or white noise machine o Turn off cell phone and/or email alerts during important tasks. In fact, it is  helpful to schedule a block of time each day where you limit phone and email interruptions and focus on just the more detailed work you have. o Try to minimize the amount of background noise (i.e., television, music) when engaged in important tasks or conversations  with others (note that some individuals find soft background music helpful in minimize distraction, so you may need to experiment with optimal level of noise for specific situations)  . Use active effort = consciously attending to details, closely analyzing o Failures of encoding may reflect failure to attend to one's own actions o Be prepared to work more slowly than you usually do  o When reading, allow time for re-reading sections  o Check your work for errors  . Avoid multitasking o Do not attempt to complete more than one task at one time. Focus on one task until it is completed and then move on to the next one. o Avoid other activities while engaged in important tasks, such as talking on the phone while balancing the checkbook, making a shopping list during a meeting.   . Use self-talk during tasks o Repeat the steps of the activity to yourself as you complete them o Talk to yourself about your progress o This helps you maintain focus on the task and makes it easier to remember completing the task (Similar to "active effort" above)  . Conserve energy o Conserve energy to avoid fatigue and its effects on cognition - Get enough sleep - Pace yourself  and make sure to take breaks - Be open to receiving help - Exercise for increased energy  . Conversational vigilance = paying attention during a conversation  o Listen actively: focus on the speaker and position yourself so that you can clearly hear the him/her, and have open/relaxed posture  o Eye contact: Maintaining eye contact with the person you are speaking with may increase the likelihood that important information is properly received  o Ask questions: Ask questions for clarification (e.g., request that the speaker explain something in a different way) or ask for information to be repeated if you become distracted, or if you do not hear or understand something during a conversation  o Paraphrase: Summarize or repeat back  important information from a conversation in your own words to facilitate communication and ensure that you have heard correctly and understand

## 2016-07-26 DIAGNOSIS — Z299 Encounter for prophylactic measures, unspecified: Secondary | ICD-10-CM | POA: Diagnosis not present

## 2016-07-26 DIAGNOSIS — M797 Fibromyalgia: Secondary | ICD-10-CM | POA: Diagnosis not present

## 2016-07-26 DIAGNOSIS — F329 Major depressive disorder, single episode, unspecified: Secondary | ICD-10-CM | POA: Diagnosis not present

## 2016-07-26 DIAGNOSIS — Z713 Dietary counseling and surveillance: Secondary | ICD-10-CM | POA: Diagnosis not present

## 2016-07-26 DIAGNOSIS — Z6823 Body mass index (BMI) 23.0-23.9, adult: Secondary | ICD-10-CM | POA: Diagnosis not present

## 2016-08-13 DIAGNOSIS — E7800 Pure hypercholesterolemia, unspecified: Secondary | ICD-10-CM | POA: Diagnosis not present

## 2016-08-13 DIAGNOSIS — M159 Polyosteoarthritis, unspecified: Secondary | ICD-10-CM | POA: Diagnosis not present

## 2016-09-13 ENCOUNTER — Ambulatory Visit: Payer: Medicare Other | Admitting: Neurology

## 2016-09-16 ENCOUNTER — Encounter: Payer: Self-pay | Admitting: Neurology

## 2016-09-19 DIAGNOSIS — M159 Polyosteoarthritis, unspecified: Secondary | ICD-10-CM | POA: Diagnosis not present

## 2016-09-19 DIAGNOSIS — E7800 Pure hypercholesterolemia, unspecified: Secondary | ICD-10-CM | POA: Diagnosis not present

## 2016-09-26 DIAGNOSIS — Z1389 Encounter for screening for other disorder: Secondary | ICD-10-CM | POA: Diagnosis not present

## 2016-09-26 DIAGNOSIS — M797 Fibromyalgia: Secondary | ICD-10-CM | POA: Diagnosis not present

## 2016-09-26 DIAGNOSIS — Z713 Dietary counseling and surveillance: Secondary | ICD-10-CM | POA: Diagnosis not present

## 2016-09-26 DIAGNOSIS — Z6824 Body mass index (BMI) 24.0-24.9, adult: Secondary | ICD-10-CM | POA: Diagnosis not present

## 2016-09-26 DIAGNOSIS — Z299 Encounter for prophylactic measures, unspecified: Secondary | ICD-10-CM | POA: Diagnosis not present

## 2016-09-26 DIAGNOSIS — Z87891 Personal history of nicotine dependence: Secondary | ICD-10-CM | POA: Diagnosis not present

## 2016-09-26 DIAGNOSIS — E7800 Pure hypercholesterolemia, unspecified: Secondary | ICD-10-CM | POA: Diagnosis not present

## 2016-09-26 DIAGNOSIS — F329 Major depressive disorder, single episode, unspecified: Secondary | ICD-10-CM | POA: Diagnosis not present

## 2016-10-29 DIAGNOSIS — H5032 Intermittent alternating esotropia: Secondary | ICD-10-CM | POA: Diagnosis not present

## 2016-11-20 DIAGNOSIS — H5032 Intermittent alternating esotropia: Secondary | ICD-10-CM | POA: Diagnosis not present

## 2016-11-25 DIAGNOSIS — E7800 Pure hypercholesterolemia, unspecified: Secondary | ICD-10-CM | POA: Diagnosis not present

## 2016-11-25 DIAGNOSIS — F17210 Nicotine dependence, cigarettes, uncomplicated: Secondary | ICD-10-CM | POA: Diagnosis not present

## 2016-11-25 DIAGNOSIS — K219 Gastro-esophageal reflux disease without esophagitis: Secondary | ICD-10-CM | POA: Diagnosis not present

## 2016-11-25 DIAGNOSIS — F329 Major depressive disorder, single episode, unspecified: Secondary | ICD-10-CM | POA: Diagnosis not present

## 2016-11-25 DIAGNOSIS — Z6824 Body mass index (BMI) 24.0-24.9, adult: Secondary | ICD-10-CM | POA: Diagnosis not present

## 2016-11-25 DIAGNOSIS — M797 Fibromyalgia: Secondary | ICD-10-CM | POA: Diagnosis not present

## 2016-11-25 DIAGNOSIS — Z299 Encounter for prophylactic measures, unspecified: Secondary | ICD-10-CM | POA: Diagnosis not present

## 2016-11-25 DIAGNOSIS — R350 Frequency of micturition: Secondary | ICD-10-CM | POA: Diagnosis not present

## 2016-11-25 DIAGNOSIS — Z713 Dietary counseling and surveillance: Secondary | ICD-10-CM | POA: Diagnosis not present

## 2016-11-29 ENCOUNTER — Encounter (HOSPITAL_BASED_OUTPATIENT_CLINIC_OR_DEPARTMENT_OTHER): Payer: Self-pay | Admitting: *Deleted

## 2016-12-02 ENCOUNTER — Ambulatory Visit: Payer: Self-pay | Admitting: Ophthalmology

## 2016-12-02 NOTE — H&P (Signed)
Date of examination:  11-20-16  Indication for surgery: to straighten the eyes and relieve diplopia  Pertinent past medical history:  Past Medical History:  Diagnosis Date  . Anxiety    social anxiety  . Fibromyalgia   . History of kidney stones   . Hyperlipemia   . Sleep apnea     Pertinent ocular history:  Diplopia onset 3/17, has divergence insufficiency ET, plano approx  Pertinent family history:  Family History  Problem Relation Age of Onset  . Brain cancer Paternal Grandfather     General:  Healthy appearing patient in no distress.    Eyes:    Acuity [place]  OD 20/20  OS 20/20  External: Within normal limits     Anterior segment: Within normal limits     Motility:   [place] E(T)=15 comitant, LH=2, E'=4, rots nl  Fundus: deferred  Refraction:  [place] OU approx (low plus)  Heart: Regular rate and rhythm without murmur     Lungs: Clear to auscultation     Impression: Esotropia, divergence insufficiency type, with diplopia  Plan: Medial rectus muscle recession both eyes  Haley Caldwell

## 2016-12-06 ENCOUNTER — Ambulatory Visit (HOSPITAL_BASED_OUTPATIENT_CLINIC_OR_DEPARTMENT_OTHER): Payer: Medicare Other | Admitting: Anesthesiology

## 2016-12-06 ENCOUNTER — Ambulatory Visit (HOSPITAL_BASED_OUTPATIENT_CLINIC_OR_DEPARTMENT_OTHER)
Admission: RE | Admit: 2016-12-06 | Discharge: 2016-12-06 | Disposition: A | Discharge status: Home / Self Care | Payer: Medicare Other | Source: Ambulatory Visit | Attending: Ophthalmology | Admitting: Ophthalmology

## 2016-12-06 ENCOUNTER — Encounter (HOSPITAL_BASED_OUTPATIENT_CLINIC_OR_DEPARTMENT_OTHER): Payer: Self-pay | Admitting: *Deleted

## 2016-12-06 ENCOUNTER — Encounter (HOSPITAL_BASED_OUTPATIENT_CLINIC_OR_DEPARTMENT_OTHER)
Admission: RE | Disposition: A | Discharge status: Home / Self Care | Payer: Self-pay | Source: Ambulatory Visit | Attending: Ophthalmology

## 2016-12-06 DIAGNOSIS — F419 Anxiety disorder, unspecified: Secondary | ICD-10-CM | POA: Diagnosis not present

## 2016-12-06 DIAGNOSIS — G4730 Sleep apnea, unspecified: Secondary | ICD-10-CM | POA: Diagnosis not present

## 2016-12-06 DIAGNOSIS — H5000 Unspecified esotropia: Secondary | ICD-10-CM | POA: Diagnosis not present

## 2016-12-06 DIAGNOSIS — M797 Fibromyalgia: Secondary | ICD-10-CM | POA: Diagnosis not present

## 2016-12-06 DIAGNOSIS — Z87891 Personal history of nicotine dependence: Secondary | ICD-10-CM | POA: Diagnosis not present

## 2016-12-06 DIAGNOSIS — H5032 Intermittent alternating esotropia: Secondary | ICD-10-CM | POA: Diagnosis not present

## 2016-12-06 DIAGNOSIS — E785 Hyperlipidemia, unspecified: Secondary | ICD-10-CM | POA: Diagnosis not present

## 2016-12-06 HISTORY — DX: Sleep apnea, unspecified: G47.30

## 2016-12-06 HISTORY — PX: STRABISMUS SURGERY: SHX218

## 2016-12-06 HISTORY — DX: Personal history of urinary calculi: Z87.442

## 2016-12-06 HISTORY — DX: Anxiety disorder, unspecified: F41.9

## 2016-12-06 SURGERY — STRABISMUS SURGERY, BILATERAL
Anesthesia: General | Site: Eye | Laterality: Bilateral

## 2016-12-06 MED ORDER — TOBRAMYCIN-DEXAMETHASONE 0.3-0.1 % OP OINT: 1.0000 "application " | TOPICAL_OINTMENT | Freq: Two times a day (BID) | OPHTHALMIC | 0 refills | Status: DC

## 2016-12-06 MED ORDER — FENTANYL CITRATE (PF) 100 MCG/2ML IJ SOLN
INTRAMUSCULAR | Status: AC
  Filled 2016-12-06: qty 2

## 2016-12-06 MED ORDER — DEXAMETHASONE SODIUM PHOSPHATE 4 MG/ML IJ SOLN
INTRAMUSCULAR | Status: DC | PRN
  Administered 2016-12-06: 10 mg via INTRAVENOUS

## 2016-12-06 MED ORDER — GLYCOPYRROLATE 0.2 MG/ML IJ SOLN
INTRAMUSCULAR | Status: DC | PRN
  Administered 2016-12-06: .4 mg via INTRAVENOUS

## 2016-12-06 MED ORDER — PROPOFOL 10 MG/ML IV BOLUS
INTRAVENOUS | Status: DC | PRN
Start: 2016-12-06 — End: 2016-12-06
  Administered 2016-12-06: 200 mg via INTRAVENOUS

## 2016-12-06 MED ORDER — PROPOFOL 10 MG/ML IV BOLUS
INTRAVENOUS | Status: AC
  Filled 2016-12-06: qty 20

## 2016-12-06 MED ORDER — MIDAZOLAM HCL 2 MG/2ML IJ SOLN
1.0000 mg | INTRAMUSCULAR | Status: DC | PRN
  Administered 2016-12-06: 2 mg via INTRAVENOUS

## 2016-12-06 MED ORDER — KETOROLAC TROMETHAMINE 30 MG/ML IJ SOLN
INTRAMUSCULAR | Status: AC
  Filled 2016-12-06: qty 1

## 2016-12-06 MED ORDER — LIDOCAINE 2% (20 MG/ML) 5 ML SYRINGE
INTRAMUSCULAR | Status: DC | PRN
  Administered 2016-12-06: 100 mg via INTRAVENOUS

## 2016-12-06 MED ORDER — LIDOCAINE 2% (20 MG/ML) 5 ML SYRINGE
INTRAMUSCULAR | Status: AC
Start: 2016-12-06 — End: 2016-12-06
  Filled 2016-12-06: qty 5

## 2016-12-06 MED ORDER — FENTANYL CITRATE (PF) 100 MCG/2ML IJ SOLN: 25.0000 ug | INTRAMUSCULAR | Status: DC | PRN

## 2016-12-06 MED ORDER — LACTATED RINGERS IV SOLN
INTRAVENOUS | Status: DC
  Administered 2016-12-06: 10:00:00 via INTRAVENOUS

## 2016-12-06 MED ORDER — DEXAMETHASONE SODIUM PHOSPHATE 10 MG/ML IJ SOLN
INTRAMUSCULAR | Status: AC
  Filled 2016-12-06: qty 1

## 2016-12-06 MED ORDER — TOBRAMYCIN-DEXAMETHASONE 0.3-0.1 % OP OINT
TOPICAL_OINTMENT | OPHTHALMIC | Status: DC | PRN
  Administered 2016-12-06: 1 via OPHTHALMIC

## 2016-12-06 MED ORDER — PROMETHAZINE HCL 25 MG/ML IJ SOLN: 6.2500 mg | INTRAMUSCULAR | Status: DC | PRN

## 2016-12-06 MED ORDER — ONDANSETRON HCL 4 MG/2ML IJ SOLN
INTRAMUSCULAR | Status: AC
  Filled 2016-12-06: qty 2

## 2016-12-06 MED ORDER — SCOPOLAMINE 1 MG/3DAYS TD PT72: 1.0000 | MEDICATED_PATCH | Freq: Once | TRANSDERMAL | Status: DC | PRN

## 2016-12-06 MED ORDER — KETOROLAC TROMETHAMINE 30 MG/ML IJ SOLN
INTRAMUSCULAR | Status: DC | PRN
  Administered 2016-12-06: 30 mg via INTRAVENOUS

## 2016-12-06 MED ORDER — ONDANSETRON HCL 4 MG/2ML IJ SOLN
INTRAMUSCULAR | Status: DC | PRN
  Administered 2016-12-06: 4 mg via INTRAVENOUS

## 2016-12-06 MED ORDER — FENTANYL CITRATE (PF) 100 MCG/2ML IJ SOLN
50.0000 ug | INTRAMUSCULAR | Status: DC | PRN
  Administered 2016-12-06: 50 ug via INTRAVENOUS
  Administered 2016-12-06: 100 ug via INTRAVENOUS

## 2016-12-06 MED ORDER — MIDAZOLAM HCL 2 MG/2ML IJ SOLN
INTRAMUSCULAR | Status: AC
  Filled 2016-12-06: qty 2

## 2016-12-06 SURGICAL SUPPLY — 33 items
APL SRG 3 HI ABS STRL LF PLS (MISCELLANEOUS) ×1
APPLICATOR COTTON TIP 6IN STRL (MISCELLANEOUS) ×12 IMPLANT
APPLICATOR DR MATTHEWS STRL (MISCELLANEOUS) ×3 IMPLANT
BANDAGE EYE OVAL (MISCELLANEOUS) IMPLANT
CAUTERY EYE LOW TEMP 1300F FIN (OPHTHALMIC RELATED) IMPLANT
CLOSURE WOUND 1/4X4 (GAUZE/BANDAGES/DRESSINGS)
COVER BACK TABLE 60X90IN (DRAPES) ×3 IMPLANT
COVER MAYO STAND STRL (DRAPES) ×3 IMPLANT
DRAPE SURG 17X23 STRL (DRAPES) ×6 IMPLANT
DRAPE U-SHAPE 76X120 STRL (DRAPES) ×2 IMPLANT
GLOVE BIO SURGEON STRL SZ 6.5 (GLOVE) ×4 IMPLANT
GLOVE BIO SURGEONS STRL SZ 6.5 (GLOVE) ×2
GLOVE BIOGEL M STRL SZ7.5 (GLOVE) ×3 IMPLANT
GOWN STRL REUS W/ TWL LRG LVL3 (GOWN DISPOSABLE) ×1 IMPLANT
GOWN STRL REUS W/TWL LRG LVL3 (GOWN DISPOSABLE) ×3
GOWN STRL REUS W/TWL XL LVL3 (GOWN DISPOSABLE) ×4 IMPLANT
NS IRRIG 1000ML POUR BTL (IV SOLUTION) ×3 IMPLANT
PACK BASIN DAY SURGERY FS (CUSTOM PROCEDURE TRAY) ×3 IMPLANT
SHEET MEDIUM DRAPE 40X70 STRL (DRAPES) IMPLANT
SLEEVE SCD COMPRESS KNEE MED (MISCELLANEOUS) ×3 IMPLANT
SPEAR EYE SURG WECK-CEL (MISCELLANEOUS) ×6 IMPLANT
STRIP CLOSURE SKIN 1/4X4 (GAUZE/BANDAGES/DRESSINGS) IMPLANT
SUT 6 0 SILK T G140 8DA (SUTURE) IMPLANT
SUT MERSILENE 6-0 18IN S14 8MM (SUTURE)
SUT PLAIN 6 0 TG1408 (SUTURE) ×2 IMPLANT
SUT SILK 4 0 C 3 735G (SUTURE) IMPLANT
SUT VICRYL 6 0 S 28 (SUTURE) IMPLANT
SUT VICRYL ABS 6-0 S29 18IN (SUTURE) ×4 IMPLANT
SUTURE MERSLN 6-0 18IN S14 8MM (SUTURE) IMPLANT
SYR 10ML LL (SYRINGE) ×3 IMPLANT
SYR TB 1ML LL NO SAFETY (SYRINGE) ×3 IMPLANT
TOWEL OR 17X24 6PK STRL BLUE (TOWEL DISPOSABLE) ×3 IMPLANT
TRAY DSU PREP LF (CUSTOM PROCEDURE TRAY) ×3 IMPLANT

## 2016-12-06 NOTE — Anesthesia Preprocedure Evaluation (Addendum)
Anesthesia Evaluation  Patient identified by MRN, date of birth, ID band Patient awake    Reviewed: Allergy & Precautions, NPO status , Patient's Chart, lab work & pertinent test results  Airway Mallampati: I  TM Distance: >3 FB Neck ROM: Full    Dental  (+) Teeth Intact, Dental Advisory Given, Caps   Pulmonary sleep apnea , former smoker,    Pulmonary exam normal breath sounds clear to auscultation       Cardiovascular Exercise Tolerance: Good negative cardio ROS Normal cardiovascular exam Rhythm:Regular Rate:Normal     Neuro/Psych PSYCHIATRIC DISORDERS Anxiety negative neurological ROS     GI/Hepatic negative GI ROS, Neg liver ROS,   Endo/Other  negative endocrine ROS  Renal/GU negative Renal ROS     Musculoskeletal  (+) Fibromyalgia -, narcotic dependent  Abdominal   Peds  Hematology negative hematology ROS (+)   Anesthesia Other Findings Day of surgery medications reviewed with the patient.  Reproductive/Obstetrics                            Anesthesia Physical Anesthesia Plan  ASA: II  Anesthesia Plan: General   Post-op Pain Management:    Induction: Intravenous  Airway Management Planned: LMA  Additional Equipment:   Intra-op Plan:   Post-operative Plan: Extubation in OR  Informed Consent: I have reviewed the patients History and Physical, chart, labs and discussed the procedure including the risks, benefits and alternatives for the proposed anesthesia with the patient or authorized representative who has indicated his/her understanding and acceptance.   Dental advisory given  Plan Discussed with: CRNA  Anesthesia Plan Comments:         Anesthesia Quick Evaluation

## 2016-12-06 NOTE — Transfer of Care (Signed)
Immediate Anesthesia Transfer of Care Note  Patient: Haley Caldwell  Procedure(s) Performed: Procedure(s): BILATERAL STRABISMUS REPAIR (Bilateral)  Patient Location: PACU  Anesthesia Type:General  Level of Consciousness: awake and alert   Airway & Oxygen Therapy: Patient Spontanous Breathing and Patient connected to face mask oxygen  Post-op Assessment: Report given to RN and Post -op Vital signs reviewed and stable  Post vital signs: Reviewed and stable  Last Vitals:  Vitals:   12/06/16 1007  BP: 119/67  Pulse: 65  Resp: 18  Temp: 36.8 C    Last Pain:  Vitals:   12/06/16 1007  TempSrc: Oral         Complications: No apparent anesthesia complications

## 2016-12-06 NOTE — Anesthesia Postprocedure Evaluation (Signed)
Anesthesia Post Note  Patient: Haley Caldwell  Procedure(s) Performed: Procedure(s) (LRB): BILATERAL STRABISMUS REPAIR (Bilateral)  Patient location during evaluation: PACU Anesthesia Type: General Level of consciousness: awake and alert Pain management: pain level controlled Vital Signs Assessment: post-procedure vital signs reviewed and stable Respiratory status: spontaneous breathing, nonlabored ventilation, respiratory function stable and patient connected to nasal cannula oxygen Cardiovascular status: blood pressure returned to baseline and stable Postop Assessment: no signs of nausea or vomiting Anesthetic complications: no       Last Vitals:  Vitals:   12/06/16 1330 12/06/16 1358  BP: 104/74 (!) 156/84  Pulse: 94 84  Resp: 14 16  Temp:  36.8 C    Last Pain:  Vitals:   12/06/16 1007  TempSrc: Oral                 Catalina Gravel

## 2016-12-06 NOTE — Op Note (Signed)
12/06/2016  1:00 PM  PATIENT:  Haley Caldwell  56 y.o. female  PRE-OPERATIVE DIAGNOSIS:  Esotropia  POST-OPERATIVE DIAGNOSIS:  Esotropia     PROCEDURE:  Medial rectus muscle recession  4.5 mm both eye(s)  SURGEON:  Lorne Skeens.Annamaria Boots, M.D.   ANESTHESIA:   general  COMPLICATIONS:None  DESCRIPTION OF PROCEDURE: The patient was taken to the operating room where She was identified by me. General anesthesia was induced without difficulty after placement of appropriate monitors. The patient was prepped and draped in standard sterile fashion. A lid speculum was placed in the left eye.  Through an inferonasal fornix incision through conjunctiva and Tenon's fascia, the left medial rectus muscle was engaged on a series of muscle hooks and cleared of its fascial attachments. The tendon was secured with a double-armed 6-0 Vicryl suture with a double locking bite at each border of the muscle, 1 mm from the insertion. The muscle was disinserted, and was reattached to sclera at a measured distance of 4.5 millimeters posterior to the original insertion, using direct scleral passes in crossed swords fashion.  The suture ends were tied securely after the position of the muscle had been checked and found to be accurate. Conjunctiva was closed with one 6-0 plain gut suture.  The speculum was transferred to the left eye, where an identical procedure was performed, again effecting a 4.5 millimeters recession of the medial rectus muscle. TobraDex ointment was placed in both eye(s). The patient was awakened without difficulty and taken to the recovery room in stable condition, having suffered no intraoperative or immediate postoperative complications.  Lorne Skeens. Aveer Bartow M.D.    PATIENT DISPOSITION:  PACU - hemodynamically stable.

## 2016-12-06 NOTE — Discharge Instructions (Signed)
Diet: Clear liquids, advance to soft foods then regular diet as tolerated by this evening. ° °Pain control:  ° 1)  Ibuprofen 600 mg by mouth every 6-8 hours as needed for pain ° 2)  Ice pack/cold compress to operated eye(s) as desired ° °Eye medications:  Tobradex or Zylet eye ointment 1/2 inch in operated eye(s) twice a day if directed to do so by Dr. Young ° °Activity: No swimming for 1 week.  It is OK to let water run over the face and eyes while showering or taking a bath, even during the first week.  No other restriction on exercise or activity. ° ° °Call Dr. Young's office [phone] with any problems or concerns. ° ° ° ° °Post Anesthesia Home Care Instructions ° °Activity: °Get plenty of rest for the remainder of the day. A responsible individual must stay with you for 24 hours following the procedure.  °For the next 24 hours, DO NOT: °-Drive a car °-Operate machinery °-Drink alcoholic beverages °-Take any medication unless instructed by your physician °-Make any legal decisions or sign important papers. ° °Meals: °Start with liquid foods such as gelatin or soup. Progress to regular foods as tolerated. Avoid greasy, spicy, heavy foods. If nausea and/or vomiting occur, drink only clear liquids until the nausea and/or vomiting subsides. Call your physician if vomiting continues. ° °Special Instructions/Symptoms: °Your throat may feel dry or sore from the anesthesia or the breathing tube placed in your throat during surgery. If this causes discomfort, gargle with warm salt water. The discomfort should disappear within 24 hours. ° °If you had a scopolamine patch placed behind your ear for the management of post- operative nausea and/or vomiting: ° °1. The medication in the patch is effective for 72 hours, after which it should be removed.  Wrap patch in a tissue and discard in the trash. Wash hands thoroughly with soap and water. °2. You may remove the patch earlier than 72 hours if you experience unpleasant  side effects which may include dry mouth, dizziness or visual disturbances. °3. Avoid touching the patch. Wash your hands with soap and water after contact with the patch. °   ° °

## 2016-12-06 NOTE — H&P (View-Only) (Signed)
Date of examination:  11-20-16  Indication for surgery: to straighten the eyes and relieve diplopia  Pertinent past medical history:  Past Medical History:  Diagnosis Date  . Anxiety    social anxiety  . Fibromyalgia   . History of kidney stones   . Hyperlipemia   . Sleep apnea     Pertinent ocular history:  Diplopia onset 3/17, has divergence insufficiency ET, plano approx  Pertinent family history:  Family History  Problem Relation Age of Onset  . Brain cancer Paternal Grandfather     General:  Healthy appearing patient in no distress.    Eyes:    Acuity [place]  OD 20/20  OS 20/20  External: Within normal limits     Anterior segment: Within normal limits     Motility:   [place] E(T)=15 comitant, LH=2, E'=4, rots nl  Fundus: deferred  Refraction:  [place] OU approx (low plus)  Heart: Regular rate and rhythm without murmur     Lungs: Clear to auscultation     Impression: Esotropia, divergence insufficiency type, with diplopia  Plan: Medial rectus muscle recession both eyes  Teren Zurcher O

## 2016-12-06 NOTE — Interval H&P Note (Signed)
History and Physical Interval Note:  12/06/2016 11:53 AM  Haley Caldwell  has presented today for surgery, with the diagnosis of ESOTROPHIA  The various methods of treatment have been discussed with the patient and family. After consideration of risks, benefits and other options for treatment, the patient has consented to  Procedure(s): REPAIR STRABISMUS BILATERAL (Bilateral) as a surgical intervention .  The patient's history has been reviewed, patient examined, no change in status, stable for surgery.  I have reviewed the patient's chart and labs.  Questions were answered to the patient's satisfaction.     Derry Skill

## 2016-12-06 NOTE — Anesthesia Procedure Notes (Signed)
Procedure Name: LMA Insertion Performed by: Keziyah Kneale W Pre-anesthesia Checklist: Patient identified, Emergency Drugs available, Suction available and Patient being monitored Patient Re-evaluated:Patient Re-evaluated prior to inductionOxygen Delivery Method: Circle system utilized Preoxygenation: Pre-oxygenation with 100% oxygen Intubation Type: IV induction Ventilation: Mask ventilation without difficulty LMA: LMA flexible inserted LMA Size: 4.0 Number of attempts: 1 Placement Confirmation: positive ETCO2 Tube secured with: Tape Dental Injury: Teeth and Oropharynx as per pre-operative assessment        

## 2016-12-09 ENCOUNTER — Encounter (HOSPITAL_BASED_OUTPATIENT_CLINIC_OR_DEPARTMENT_OTHER): Payer: Self-pay | Admitting: Ophthalmology

## 2016-12-31 DIAGNOSIS — E7800 Pure hypercholesterolemia, unspecified: Secondary | ICD-10-CM | POA: Diagnosis not present

## 2016-12-31 DIAGNOSIS — M159 Polyosteoarthritis, unspecified: Secondary | ICD-10-CM | POA: Diagnosis not present

## 2017-01-24 DIAGNOSIS — F329 Major depressive disorder, single episode, unspecified: Secondary | ICD-10-CM | POA: Diagnosis not present

## 2017-01-24 DIAGNOSIS — Z0000 Encounter for general adult medical examination without abnormal findings: Secondary | ICD-10-CM | POA: Diagnosis not present

## 2017-01-24 DIAGNOSIS — F17210 Nicotine dependence, cigarettes, uncomplicated: Secondary | ICD-10-CM | POA: Diagnosis not present

## 2017-01-24 DIAGNOSIS — Z1389 Encounter for screening for other disorder: Secondary | ICD-10-CM | POA: Diagnosis not present

## 2017-01-24 DIAGNOSIS — Z299 Encounter for prophylactic measures, unspecified: Secondary | ICD-10-CM | POA: Diagnosis not present

## 2017-01-24 DIAGNOSIS — E7800 Pure hypercholesterolemia, unspecified: Secondary | ICD-10-CM | POA: Diagnosis not present

## 2017-01-24 DIAGNOSIS — Z6824 Body mass index (BMI) 24.0-24.9, adult: Secondary | ICD-10-CM | POA: Diagnosis not present

## 2017-01-24 DIAGNOSIS — Z1211 Encounter for screening for malignant neoplasm of colon: Secondary | ICD-10-CM | POA: Diagnosis not present

## 2017-01-24 DIAGNOSIS — Z6823 Body mass index (BMI) 23.0-23.9, adult: Secondary | ICD-10-CM | POA: Diagnosis not present

## 2017-01-24 DIAGNOSIS — R5383 Other fatigue: Secondary | ICD-10-CM | POA: Diagnosis not present

## 2017-01-24 DIAGNOSIS — R252 Cramp and spasm: Secondary | ICD-10-CM | POA: Diagnosis not present

## 2017-01-24 DIAGNOSIS — Z79899 Other long term (current) drug therapy: Secondary | ICD-10-CM | POA: Diagnosis not present

## 2017-01-24 DIAGNOSIS — Z7189 Other specified counseling: Secondary | ICD-10-CM | POA: Diagnosis not present

## 2017-01-24 DIAGNOSIS — G5600 Carpal tunnel syndrome, unspecified upper limb: Secondary | ICD-10-CM | POA: Diagnosis not present

## 2017-01-24 DIAGNOSIS — E559 Vitamin D deficiency, unspecified: Secondary | ICD-10-CM | POA: Diagnosis not present

## 2017-02-03 DIAGNOSIS — L820 Inflamed seborrheic keratosis: Secondary | ICD-10-CM | POA: Diagnosis not present

## 2017-02-03 DIAGNOSIS — X32XXXD Exposure to sunlight, subsequent encounter: Secondary | ICD-10-CM | POA: Diagnosis not present

## 2017-02-03 DIAGNOSIS — L570 Actinic keratosis: Secondary | ICD-10-CM | POA: Diagnosis not present

## 2017-03-24 DIAGNOSIS — M159 Polyosteoarthritis, unspecified: Secondary | ICD-10-CM | POA: Diagnosis not present

## 2017-03-24 DIAGNOSIS — E7800 Pure hypercholesterolemia, unspecified: Secondary | ICD-10-CM | POA: Diagnosis not present

## 2017-03-26 DIAGNOSIS — Z6823 Body mass index (BMI) 23.0-23.9, adult: Secondary | ICD-10-CM | POA: Diagnosis not present

## 2017-03-26 DIAGNOSIS — G8929 Other chronic pain: Secondary | ICD-10-CM | POA: Diagnosis not present

## 2017-03-26 DIAGNOSIS — G5600 Carpal tunnel syndrome, unspecified upper limb: Secondary | ICD-10-CM | POA: Diagnosis not present

## 2017-03-26 DIAGNOSIS — Z713 Dietary counseling and surveillance: Secondary | ICD-10-CM | POA: Diagnosis not present

## 2017-03-26 DIAGNOSIS — E7800 Pure hypercholesterolemia, unspecified: Secondary | ICD-10-CM | POA: Diagnosis not present

## 2017-03-26 DIAGNOSIS — Z299 Encounter for prophylactic measures, unspecified: Secondary | ICD-10-CM | POA: Diagnosis not present

## 2017-03-26 DIAGNOSIS — F17210 Nicotine dependence, cigarettes, uncomplicated: Secondary | ICD-10-CM | POA: Diagnosis not present

## 2017-03-26 DIAGNOSIS — F329 Major depressive disorder, single episode, unspecified: Secondary | ICD-10-CM | POA: Diagnosis not present

## 2017-05-27 DIAGNOSIS — G8929 Other chronic pain: Secondary | ICD-10-CM | POA: Diagnosis not present

## 2017-05-27 DIAGNOSIS — F17210 Nicotine dependence, cigarettes, uncomplicated: Secondary | ICD-10-CM | POA: Diagnosis not present

## 2017-05-27 DIAGNOSIS — E7800 Pure hypercholesterolemia, unspecified: Secondary | ICD-10-CM | POA: Diagnosis not present

## 2017-05-27 DIAGNOSIS — Z299 Encounter for prophylactic measures, unspecified: Secondary | ICD-10-CM | POA: Diagnosis not present

## 2017-05-27 DIAGNOSIS — Z6825 Body mass index (BMI) 25.0-25.9, adult: Secondary | ICD-10-CM | POA: Diagnosis not present

## 2017-05-27 DIAGNOSIS — G5600 Carpal tunnel syndrome, unspecified upper limb: Secondary | ICD-10-CM | POA: Diagnosis not present

## 2017-05-30 DIAGNOSIS — M159 Polyosteoarthritis, unspecified: Secondary | ICD-10-CM | POA: Diagnosis not present

## 2017-05-30 DIAGNOSIS — E7800 Pure hypercholesterolemia, unspecified: Secondary | ICD-10-CM | POA: Diagnosis not present

## 2017-07-24 DIAGNOSIS — Z2821 Immunization not carried out because of patient refusal: Secondary | ICD-10-CM | POA: Diagnosis not present

## 2017-07-24 DIAGNOSIS — R350 Frequency of micturition: Secondary | ICD-10-CM | POA: Diagnosis not present

## 2017-07-24 DIAGNOSIS — Z6824 Body mass index (BMI) 24.0-24.9, adult: Secondary | ICD-10-CM | POA: Diagnosis not present

## 2017-07-24 DIAGNOSIS — F329 Major depressive disorder, single episode, unspecified: Secondary | ICD-10-CM | POA: Diagnosis not present

## 2017-07-24 DIAGNOSIS — E7800 Pure hypercholesterolemia, unspecified: Secondary | ICD-10-CM | POA: Diagnosis not present

## 2017-07-24 DIAGNOSIS — Z299 Encounter for prophylactic measures, unspecified: Secondary | ICD-10-CM | POA: Diagnosis not present

## 2017-07-24 DIAGNOSIS — G8929 Other chronic pain: Secondary | ICD-10-CM | POA: Diagnosis not present

## 2017-07-29 ENCOUNTER — Emergency Department (HOSPITAL_COMMUNITY)
Admission: EM | Admit: 2017-07-29 | Discharge: 2017-07-29 | Disposition: A | Discharge status: Home / Self Care | Payer: Medicare Other | Attending: Emergency Medicine | Admitting: Emergency Medicine

## 2017-07-29 ENCOUNTER — Encounter (HOSPITAL_COMMUNITY): Payer: Self-pay | Admitting: Emergency Medicine

## 2017-07-29 ENCOUNTER — Other Ambulatory Visit: Payer: Self-pay

## 2017-07-29 ENCOUNTER — Emergency Department (HOSPITAL_COMMUNITY): Payer: Medicare Other

## 2017-07-29 DIAGNOSIS — M25552 Pain in left hip: Secondary | ICD-10-CM | POA: Diagnosis not present

## 2017-07-29 DIAGNOSIS — Z79899 Other long term (current) drug therapy: Secondary | ICD-10-CM | POA: Diagnosis not present

## 2017-07-29 DIAGNOSIS — Z87891 Personal history of nicotine dependence: Secondary | ICD-10-CM | POA: Diagnosis not present

## 2017-07-29 DIAGNOSIS — M25562 Pain in left knee: Secondary | ICD-10-CM | POA: Diagnosis not present

## 2017-07-29 DIAGNOSIS — E785 Hyperlipidemia, unspecified: Secondary | ICD-10-CM | POA: Diagnosis not present

## 2017-07-29 DIAGNOSIS — R0781 Pleurodynia: Secondary | ICD-10-CM | POA: Diagnosis not present

## 2017-07-29 DIAGNOSIS — S20211A Contusion of right front wall of thorax, initial encounter: Secondary | ICD-10-CM | POA: Diagnosis not present

## 2017-07-29 DIAGNOSIS — Y939 Activity, unspecified: Secondary | ICD-10-CM | POA: Diagnosis not present

## 2017-07-29 DIAGNOSIS — Y998 Other external cause status: Secondary | ICD-10-CM | POA: Insufficient documentation

## 2017-07-29 DIAGNOSIS — Y33XXXA Other specified events, undetermined intent, initial encounter: Secondary | ICD-10-CM | POA: Diagnosis not present

## 2017-07-29 DIAGNOSIS — R52 Pain, unspecified: Secondary | ICD-10-CM

## 2017-07-29 DIAGNOSIS — Y929 Unspecified place or not applicable: Secondary | ICD-10-CM | POA: Diagnosis not present

## 2017-07-29 HISTORY — DX: Personal history of other diseases of the musculoskeletal system and connective tissue: Z87.39

## 2017-07-29 MED ORDER — NAPROXEN 500 MG PO TABS: 500.0000 mg | ORAL_TABLET | Freq: Two times a day (BID) | ORAL | 0 refills | Status: DC
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
## 2017-07-29 NOTE — ED Notes (Signed)
Patient transported to X-ray 
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
## 2017-07-29 NOTE — Discharge Instructions (Signed)
You may apply ice packs on/off to your knee and ribs.  Use a towel or pillow to hold a pressure when you cough or sneeze.  Also try to take deep breaths several times a day to help prevent pneumonia.  Wear the knee brace as needed for weight bearing.  Call the orthopedic provider listed to arrange a follow-up appt in one week if not improving

## 2017-08-22 DIAGNOSIS — E7800 Pure hypercholesterolemia, unspecified: Secondary | ICD-10-CM | POA: Diagnosis not present

## 2017-08-22 DIAGNOSIS — F329 Major depressive disorder, single episode, unspecified: Secondary | ICD-10-CM | POA: Diagnosis not present

## 2017-08-22 DIAGNOSIS — Z87891 Personal history of nicotine dependence: Secondary | ICD-10-CM | POA: Diagnosis not present

## 2017-08-22 DIAGNOSIS — Z6824 Body mass index (BMI) 24.0-24.9, adult: Secondary | ICD-10-CM | POA: Diagnosis not present

## 2017-08-22 DIAGNOSIS — Z299 Encounter for prophylactic measures, unspecified: Secondary | ICD-10-CM | POA: Diagnosis not present

## 2017-08-22 DIAGNOSIS — G8929 Other chronic pain: Secondary | ICD-10-CM | POA: Diagnosis not present

## 2017-09-26 DIAGNOSIS — B029 Zoster without complications: Secondary | ICD-10-CM | POA: Diagnosis not present

## 2017-09-26 DIAGNOSIS — E7800 Pure hypercholesterolemia, unspecified: Secondary | ICD-10-CM | POA: Diagnosis not present

## 2017-09-26 DIAGNOSIS — Z79899 Other long term (current) drug therapy: Secondary | ICD-10-CM | POA: Diagnosis not present

## 2017-09-26 DIAGNOSIS — Z299 Encounter for prophylactic measures, unspecified: Secondary | ICD-10-CM | POA: Diagnosis not present

## 2017-09-26 DIAGNOSIS — M797 Fibromyalgia: Secondary | ICD-10-CM | POA: Diagnosis not present

## 2017-09-26 DIAGNOSIS — Z6824 Body mass index (BMI) 24.0-24.9, adult: Secondary | ICD-10-CM | POA: Diagnosis not present

## 2017-10-27 DIAGNOSIS — F17210 Nicotine dependence, cigarettes, uncomplicated: Secondary | ICD-10-CM | POA: Diagnosis not present

## 2017-10-27 DIAGNOSIS — G8929 Other chronic pain: Secondary | ICD-10-CM | POA: Diagnosis not present

## 2017-10-27 DIAGNOSIS — E7800 Pure hypercholesterolemia, unspecified: Secondary | ICD-10-CM | POA: Diagnosis not present

## 2017-10-27 DIAGNOSIS — Z6824 Body mass index (BMI) 24.0-24.9, adult: Secondary | ICD-10-CM | POA: Diagnosis not present

## 2017-10-27 DIAGNOSIS — Z299 Encounter for prophylactic measures, unspecified: Secondary | ICD-10-CM | POA: Diagnosis not present

## 2017-11-26 DIAGNOSIS — Z713 Dietary counseling and surveillance: Secondary | ICD-10-CM | POA: Diagnosis not present

## 2017-11-26 DIAGNOSIS — Z6823 Body mass index (BMI) 23.0-23.9, adult: Secondary | ICD-10-CM | POA: Diagnosis not present

## 2017-11-26 DIAGNOSIS — G8929 Other chronic pain: Secondary | ICD-10-CM | POA: Diagnosis not present

## 2017-11-26 DIAGNOSIS — M797 Fibromyalgia: Secondary | ICD-10-CM | POA: Diagnosis not present

## 2017-11-26 DIAGNOSIS — Z299 Encounter for prophylactic measures, unspecified: Secondary | ICD-10-CM | POA: Diagnosis not present

## 2017-12-26 DIAGNOSIS — F329 Major depressive disorder, single episode, unspecified: Secondary | ICD-10-CM | POA: Diagnosis not present

## 2017-12-26 DIAGNOSIS — Z299 Encounter for prophylactic measures, unspecified: Secondary | ICD-10-CM | POA: Diagnosis not present

## 2017-12-26 DIAGNOSIS — T148XXA Other injury of unspecified body region, initial encounter: Secondary | ICD-10-CM | POA: Diagnosis not present

## 2017-12-26 DIAGNOSIS — Z6824 Body mass index (BMI) 24.0-24.9, adult: Secondary | ICD-10-CM | POA: Diagnosis not present

## 2017-12-26 DIAGNOSIS — G8929 Other chronic pain: Secondary | ICD-10-CM | POA: Diagnosis not present

## 2017-12-26 DIAGNOSIS — W19XXXA Unspecified fall, initial encounter: Secondary | ICD-10-CM | POA: Diagnosis not present

## 2018-01-23 DIAGNOSIS — E7800 Pure hypercholesterolemia, unspecified: Secondary | ICD-10-CM | POA: Diagnosis not present

## 2018-01-23 DIAGNOSIS — Z299 Encounter for prophylactic measures, unspecified: Secondary | ICD-10-CM | POA: Diagnosis not present

## 2018-01-23 DIAGNOSIS — G8929 Other chronic pain: Secondary | ICD-10-CM | POA: Diagnosis not present

## 2018-01-23 DIAGNOSIS — F329 Major depressive disorder, single episode, unspecified: Secondary | ICD-10-CM | POA: Diagnosis not present

## 2018-01-23 DIAGNOSIS — Z6824 Body mass index (BMI) 24.0-24.9, adult: Secondary | ICD-10-CM | POA: Diagnosis not present

## 2018-01-27 DIAGNOSIS — M159 Polyosteoarthritis, unspecified: Secondary | ICD-10-CM | POA: Diagnosis not present

## 2018-01-27 DIAGNOSIS — E7800 Pure hypercholesterolemia, unspecified: Secondary | ICD-10-CM | POA: Diagnosis not present

## 2018-01-28 DIAGNOSIS — Z79899 Other long term (current) drug therapy: Secondary | ICD-10-CM | POA: Diagnosis not present

## 2018-01-28 DIAGNOSIS — Z7189 Other specified counseling: Secondary | ICD-10-CM | POA: Diagnosis not present

## 2018-01-28 DIAGNOSIS — Z1211 Encounter for screening for malignant neoplasm of colon: Secondary | ICD-10-CM | POA: Diagnosis not present

## 2018-01-28 DIAGNOSIS — Z1331 Encounter for screening for depression: Secondary | ICD-10-CM | POA: Diagnosis not present

## 2018-01-28 DIAGNOSIS — R5383 Other fatigue: Secondary | ICD-10-CM | POA: Diagnosis not present

## 2018-01-28 DIAGNOSIS — Z0000 Encounter for general adult medical examination without abnormal findings: Secondary | ICD-10-CM | POA: Diagnosis not present

## 2018-01-28 DIAGNOSIS — Z299 Encounter for prophylactic measures, unspecified: Secondary | ICD-10-CM | POA: Diagnosis not present

## 2018-01-28 DIAGNOSIS — Z6824 Body mass index (BMI) 24.0-24.9, adult: Secondary | ICD-10-CM | POA: Diagnosis not present

## 2018-01-28 DIAGNOSIS — E559 Vitamin D deficiency, unspecified: Secondary | ICD-10-CM | POA: Diagnosis not present

## 2018-01-28 DIAGNOSIS — Z1339 Encounter for screening examination for other mental health and behavioral disorders: Secondary | ICD-10-CM | POA: Diagnosis not present

## 2018-01-28 DIAGNOSIS — E7800 Pure hypercholesterolemia, unspecified: Secondary | ICD-10-CM | POA: Diagnosis not present

## 2018-03-25 DIAGNOSIS — Z6825 Body mass index (BMI) 25.0-25.9, adult: Secondary | ICD-10-CM | POA: Diagnosis not present

## 2018-03-25 DIAGNOSIS — Z299 Encounter for prophylactic measures, unspecified: Secondary | ICD-10-CM | POA: Diagnosis not present

## 2018-03-25 DIAGNOSIS — F329 Major depressive disorder, single episode, unspecified: Secondary | ICD-10-CM | POA: Diagnosis not present

## 2018-03-25 DIAGNOSIS — Z79899 Other long term (current) drug therapy: Secondary | ICD-10-CM | POA: Diagnosis not present

## 2018-03-25 DIAGNOSIS — M797 Fibromyalgia: Secondary | ICD-10-CM | POA: Diagnosis not present

## 2018-04-10 DIAGNOSIS — Z1231 Encounter for screening mammogram for malignant neoplasm of breast: Secondary | ICD-10-CM | POA: Diagnosis not present

## 2018-04-24 DIAGNOSIS — G8929 Other chronic pain: Secondary | ICD-10-CM | POA: Diagnosis not present

## 2018-04-24 DIAGNOSIS — E7800 Pure hypercholesterolemia, unspecified: Secondary | ICD-10-CM | POA: Diagnosis not present

## 2018-04-24 DIAGNOSIS — Z713 Dietary counseling and surveillance: Secondary | ICD-10-CM | POA: Diagnosis not present

## 2018-04-24 DIAGNOSIS — Z299 Encounter for prophylactic measures, unspecified: Secondary | ICD-10-CM | POA: Diagnosis not present

## 2018-04-24 DIAGNOSIS — Z6824 Body mass index (BMI) 24.0-24.9, adult: Secondary | ICD-10-CM | POA: Diagnosis not present

## 2018-05-25 DIAGNOSIS — M545 Low back pain: Secondary | ICD-10-CM | POA: Diagnosis not present

## 2018-05-25 DIAGNOSIS — Z299 Encounter for prophylactic measures, unspecified: Secondary | ICD-10-CM | POA: Diagnosis not present

## 2018-05-25 DIAGNOSIS — Z2821 Immunization not carried out because of patient refusal: Secondary | ICD-10-CM | POA: Diagnosis not present

## 2018-05-25 DIAGNOSIS — Z6824 Body mass index (BMI) 24.0-24.9, adult: Secondary | ICD-10-CM | POA: Diagnosis not present

## 2018-05-25 DIAGNOSIS — Z713 Dietary counseling and surveillance: Secondary | ICD-10-CM | POA: Diagnosis not present

## 2018-06-24 DIAGNOSIS — Z6824 Body mass index (BMI) 24.0-24.9, adult: Secondary | ICD-10-CM | POA: Diagnosis not present

## 2018-06-24 DIAGNOSIS — M545 Low back pain: Secondary | ICD-10-CM | POA: Diagnosis not present

## 2018-06-24 DIAGNOSIS — Z299 Encounter for prophylactic measures, unspecified: Secondary | ICD-10-CM | POA: Diagnosis not present

## 2018-07-23 DIAGNOSIS — N3941 Urge incontinence: Secondary | ICD-10-CM | POA: Diagnosis not present

## 2018-07-23 DIAGNOSIS — Z299 Encounter for prophylactic measures, unspecified: Secondary | ICD-10-CM | POA: Diagnosis not present

## 2018-07-23 DIAGNOSIS — Z6825 Body mass index (BMI) 25.0-25.9, adult: Secondary | ICD-10-CM | POA: Diagnosis not present

## 2018-07-23 DIAGNOSIS — M797 Fibromyalgia: Secondary | ICD-10-CM | POA: Diagnosis not present

## 2018-07-23 DIAGNOSIS — K219 Gastro-esophageal reflux disease without esophagitis: Secondary | ICD-10-CM | POA: Diagnosis not present

## 2018-08-19 DIAGNOSIS — Z1283 Encounter for screening for malignant neoplasm of skin: Secondary | ICD-10-CM | POA: Diagnosis not present

## 2018-08-19 DIAGNOSIS — X32XXXD Exposure to sunlight, subsequent encounter: Secondary | ICD-10-CM | POA: Diagnosis not present

## 2018-08-19 DIAGNOSIS — L570 Actinic keratosis: Secondary | ICD-10-CM | POA: Diagnosis not present

## 2018-08-19 DIAGNOSIS — D225 Melanocytic nevi of trunk: Secondary | ICD-10-CM | POA: Diagnosis not present

## 2018-08-24 DIAGNOSIS — Z6825 Body mass index (BMI) 25.0-25.9, adult: Secondary | ICD-10-CM | POA: Diagnosis not present

## 2018-08-24 DIAGNOSIS — Z299 Encounter for prophylactic measures, unspecified: Secondary | ICD-10-CM | POA: Diagnosis not present

## 2018-08-24 DIAGNOSIS — M797 Fibromyalgia: Secondary | ICD-10-CM | POA: Diagnosis not present

## 2018-08-24 DIAGNOSIS — Z87891 Personal history of nicotine dependence: Secondary | ICD-10-CM | POA: Diagnosis not present

## 2018-08-24 DIAGNOSIS — K219 Gastro-esophageal reflux disease without esophagitis: Secondary | ICD-10-CM | POA: Diagnosis not present

## 2018-08-24 DIAGNOSIS — G8929 Other chronic pain: Secondary | ICD-10-CM | POA: Diagnosis not present

## 2018-09-23 DIAGNOSIS — M797 Fibromyalgia: Secondary | ICD-10-CM | POA: Diagnosis not present

## 2018-09-23 DIAGNOSIS — Z79899 Other long term (current) drug therapy: Secondary | ICD-10-CM | POA: Diagnosis not present

## 2018-09-23 DIAGNOSIS — Z299 Encounter for prophylactic measures, unspecified: Secondary | ICD-10-CM | POA: Diagnosis not present

## 2018-09-23 DIAGNOSIS — Z6825 Body mass index (BMI) 25.0-25.9, adult: Secondary | ICD-10-CM | POA: Diagnosis not present

## 2018-09-23 DIAGNOSIS — G8929 Other chronic pain: Secondary | ICD-10-CM | POA: Diagnosis not present

## 2018-10-22 DIAGNOSIS — M797 Fibromyalgia: Secondary | ICD-10-CM | POA: Diagnosis not present

## 2018-10-22 DIAGNOSIS — Z6825 Body mass index (BMI) 25.0-25.9, adult: Secondary | ICD-10-CM | POA: Diagnosis not present

## 2018-10-22 DIAGNOSIS — Z299 Encounter for prophylactic measures, unspecified: Secondary | ICD-10-CM | POA: Diagnosis not present

## 2018-10-22 DIAGNOSIS — Z713 Dietary counseling and surveillance: Secondary | ICD-10-CM | POA: Diagnosis not present

## 2018-10-22 DIAGNOSIS — G8929 Other chronic pain: Secondary | ICD-10-CM | POA: Diagnosis not present

## 2018-11-23 DIAGNOSIS — Z299 Encounter for prophylactic measures, unspecified: Secondary | ICD-10-CM | POA: Diagnosis not present

## 2018-11-23 DIAGNOSIS — M797 Fibromyalgia: Secondary | ICD-10-CM | POA: Diagnosis not present

## 2018-11-23 DIAGNOSIS — Z713 Dietary counseling and surveillance: Secondary | ICD-10-CM | POA: Diagnosis not present

## 2018-11-23 DIAGNOSIS — Z6825 Body mass index (BMI) 25.0-25.9, adult: Secondary | ICD-10-CM | POA: Diagnosis not present

## 2018-11-23 DIAGNOSIS — E7800 Pure hypercholesterolemia, unspecified: Secondary | ICD-10-CM | POA: Diagnosis not present

## 2018-12-18 DIAGNOSIS — F329 Major depressive disorder, single episode, unspecified: Secondary | ICD-10-CM | POA: Diagnosis not present

## 2018-12-18 DIAGNOSIS — Z713 Dietary counseling and surveillance: Secondary | ICD-10-CM | POA: Diagnosis not present

## 2018-12-18 DIAGNOSIS — Z6825 Body mass index (BMI) 25.0-25.9, adult: Secondary | ICD-10-CM | POA: Diagnosis not present

## 2018-12-18 DIAGNOSIS — Z299 Encounter for prophylactic measures, unspecified: Secondary | ICD-10-CM | POA: Diagnosis not present

## 2018-12-18 DIAGNOSIS — M797 Fibromyalgia: Secondary | ICD-10-CM | POA: Diagnosis not present

## 2018-12-30 IMAGING — DX DG RIBS W/ CHEST 3+V*R*
3 series · 3 of 3 positions shown · non-contrast
Comparison: None.

CLINICAL DATA: Right rib pain

EXAM:
RIGHT RIBS AND CHEST - 3+ VIEW

[rib pa]
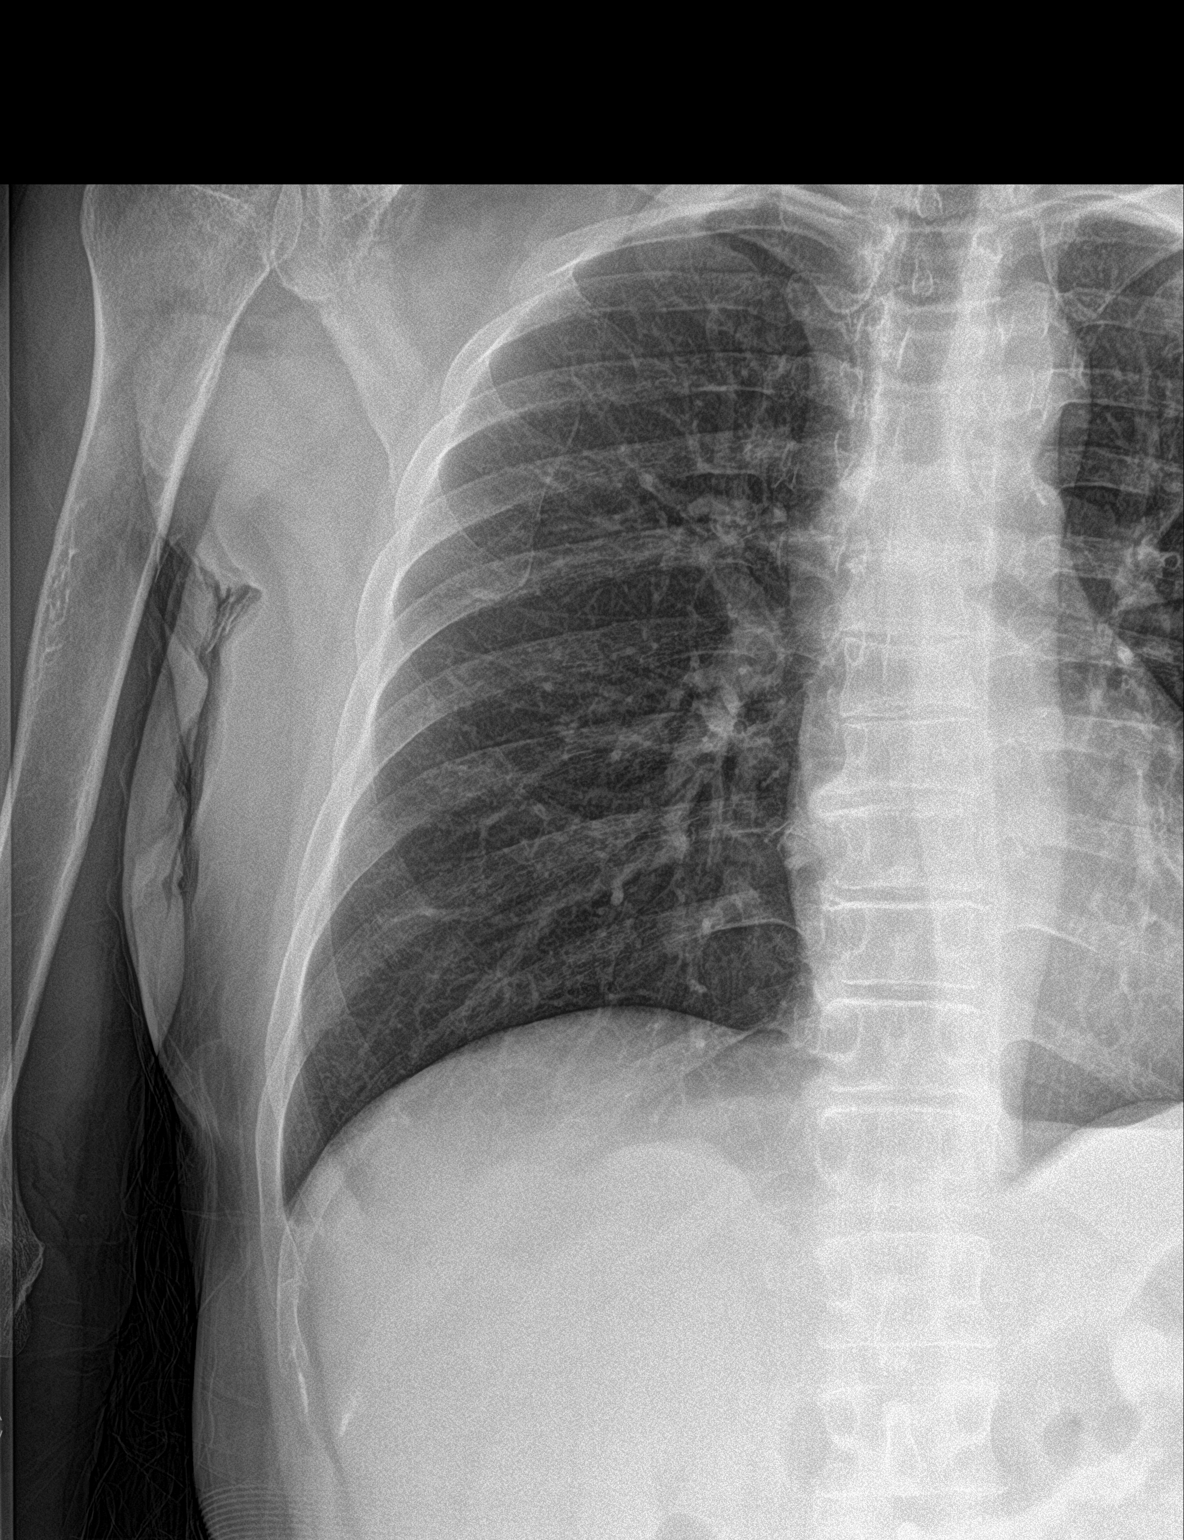

[rib pa obl]
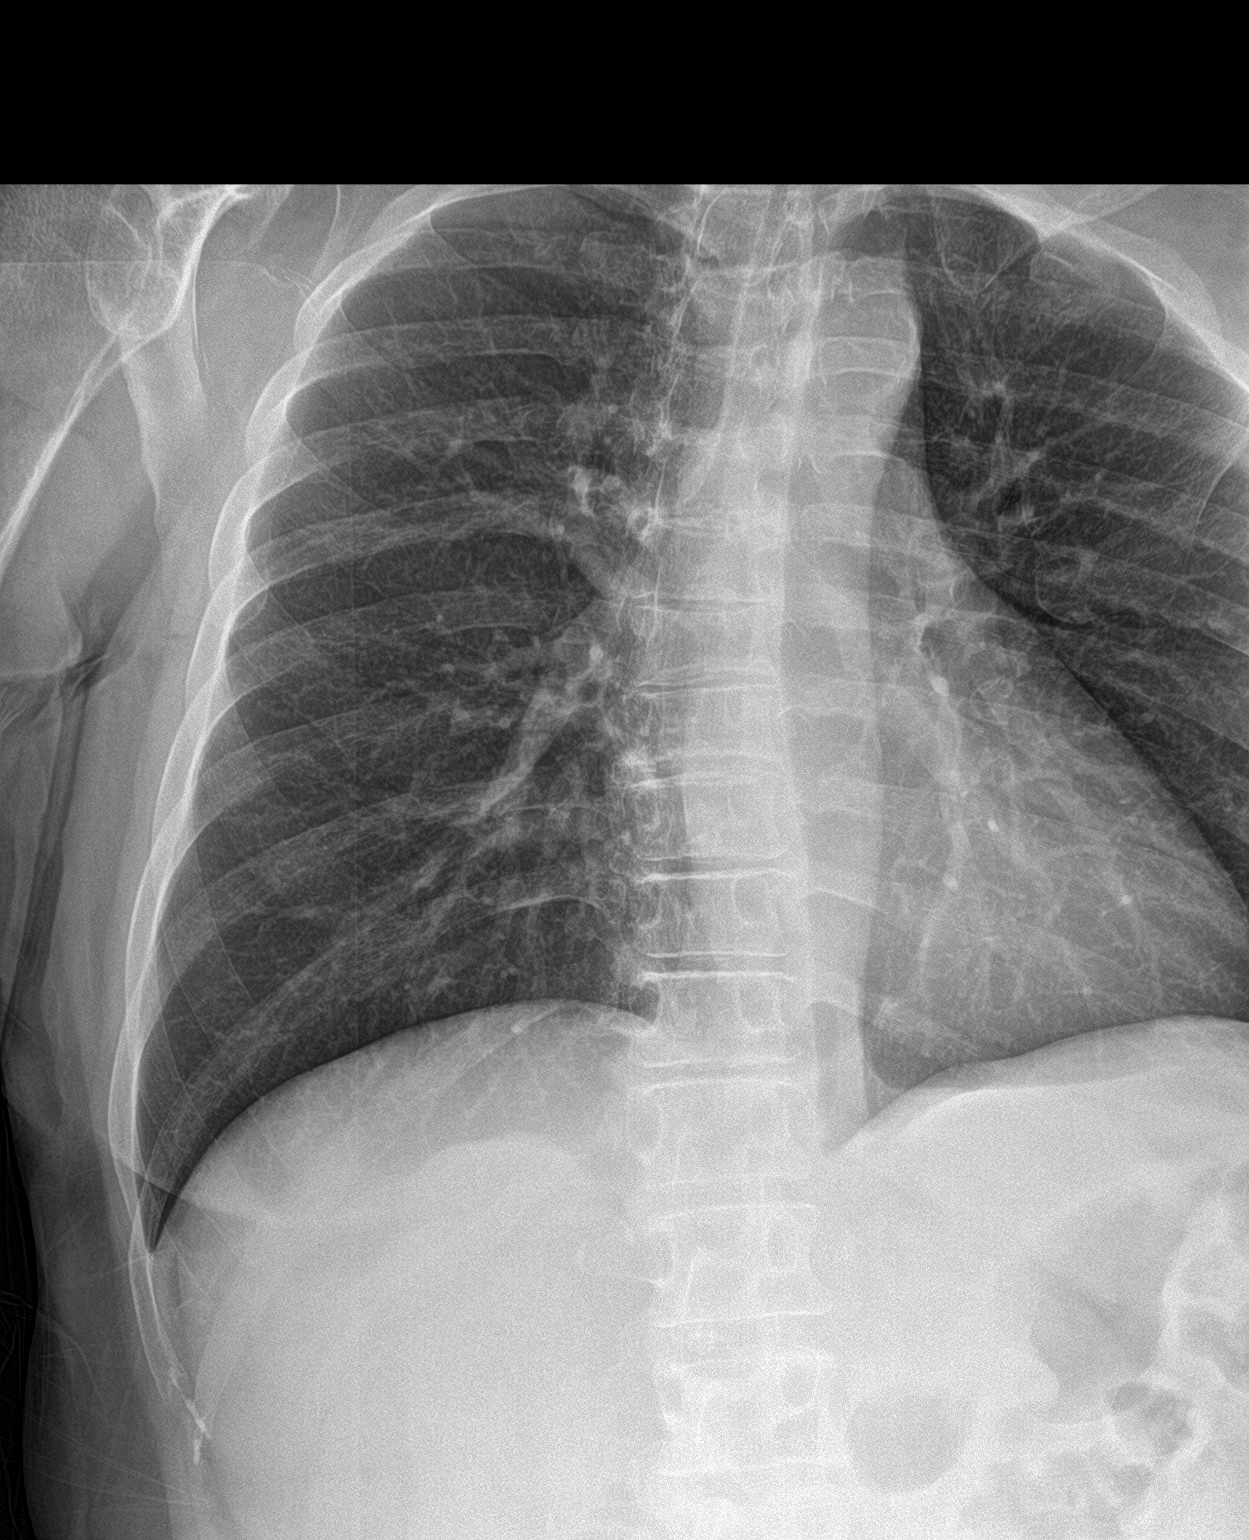

[chest ap]
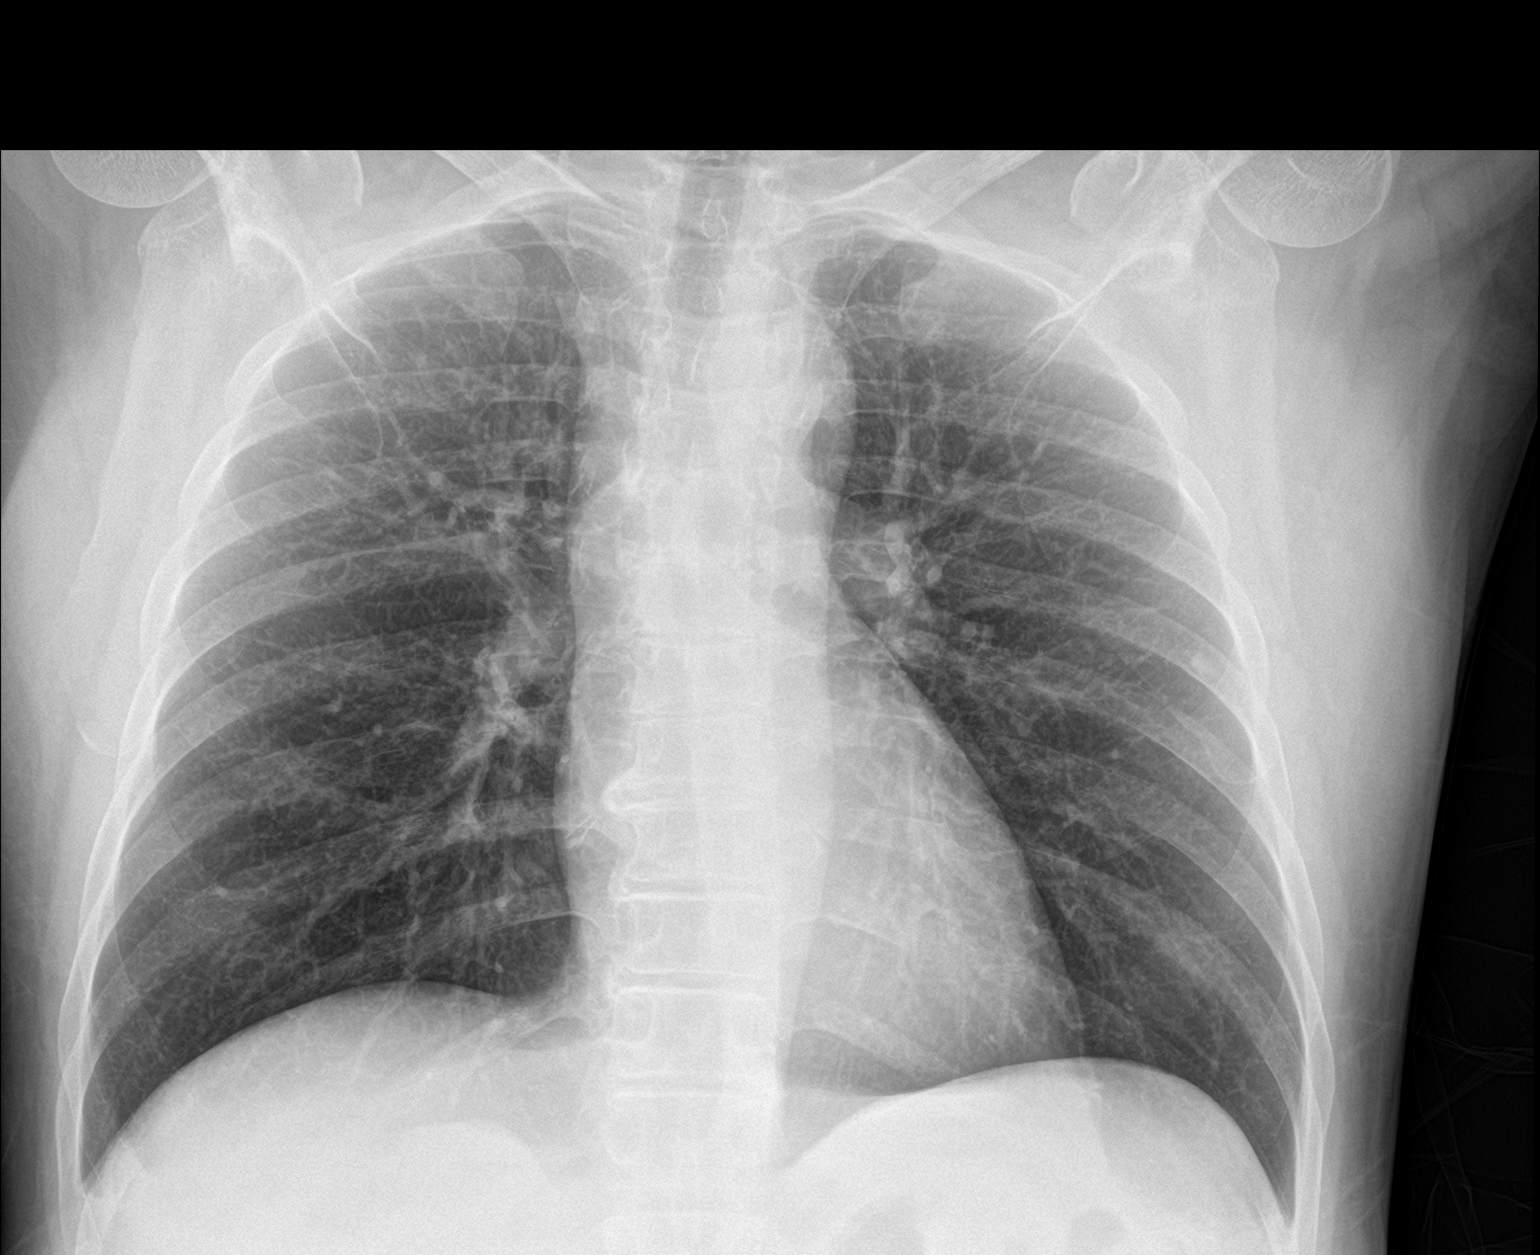

[3 of 3 positions shown; findings below may reference images not displayed]

FINDINGS: No fracture or other bone lesions are seen involving the ribs. There
is no evidence of pneumothorax or pleural effusion. Both lungs are
clear. Heart size and mediastinal contours are within normal limits.
IMPRESSION: Negative.

## 2018-12-30 IMAGING — DX DG KNEE COMPLETE 4+V*L*
4 series · 4 of 4 positions shown · non-contrast
Comparison: None.

CLINICAL DATA: Left knee pain.

EXAM:
LEFT KNEE - COMPLETE 4+ VIEW

[knee ap (1 of 3)]
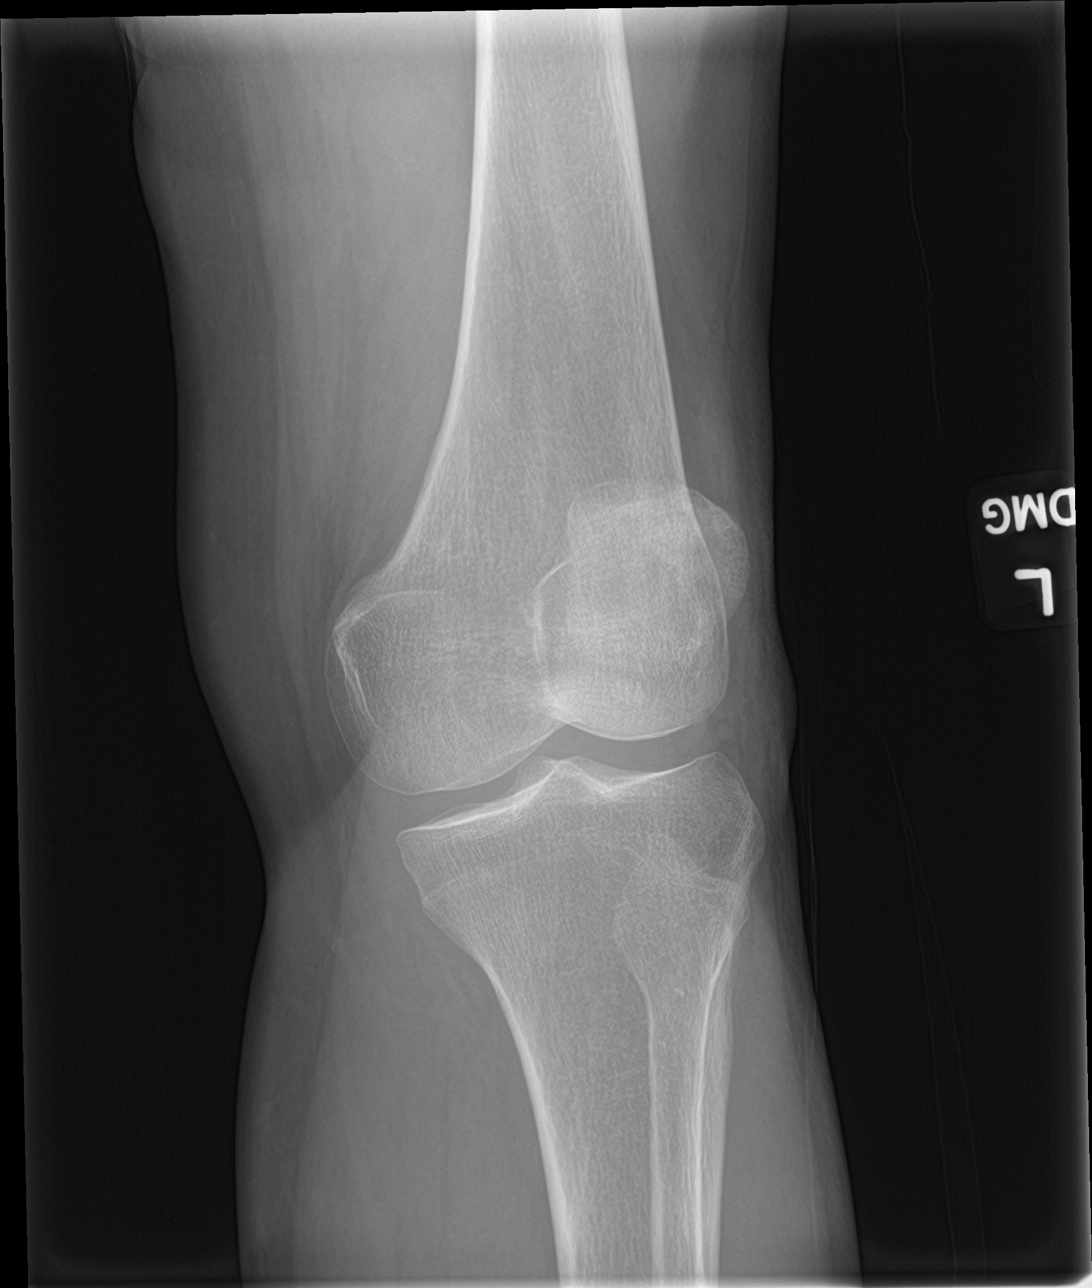

[knee lat]
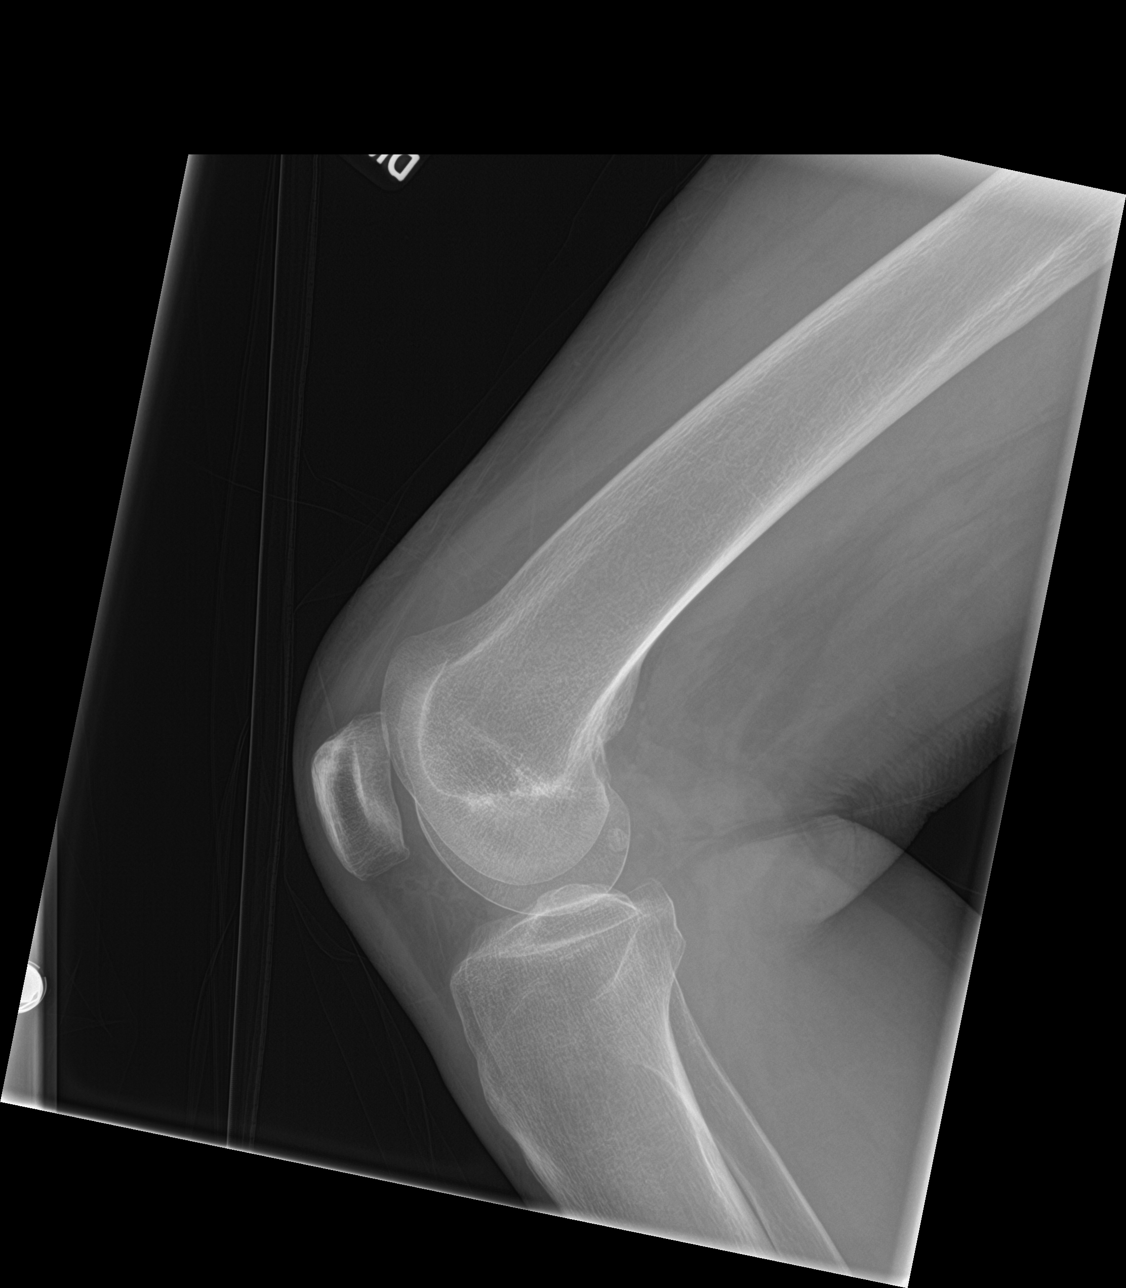

[knee ap (2 of 3)]
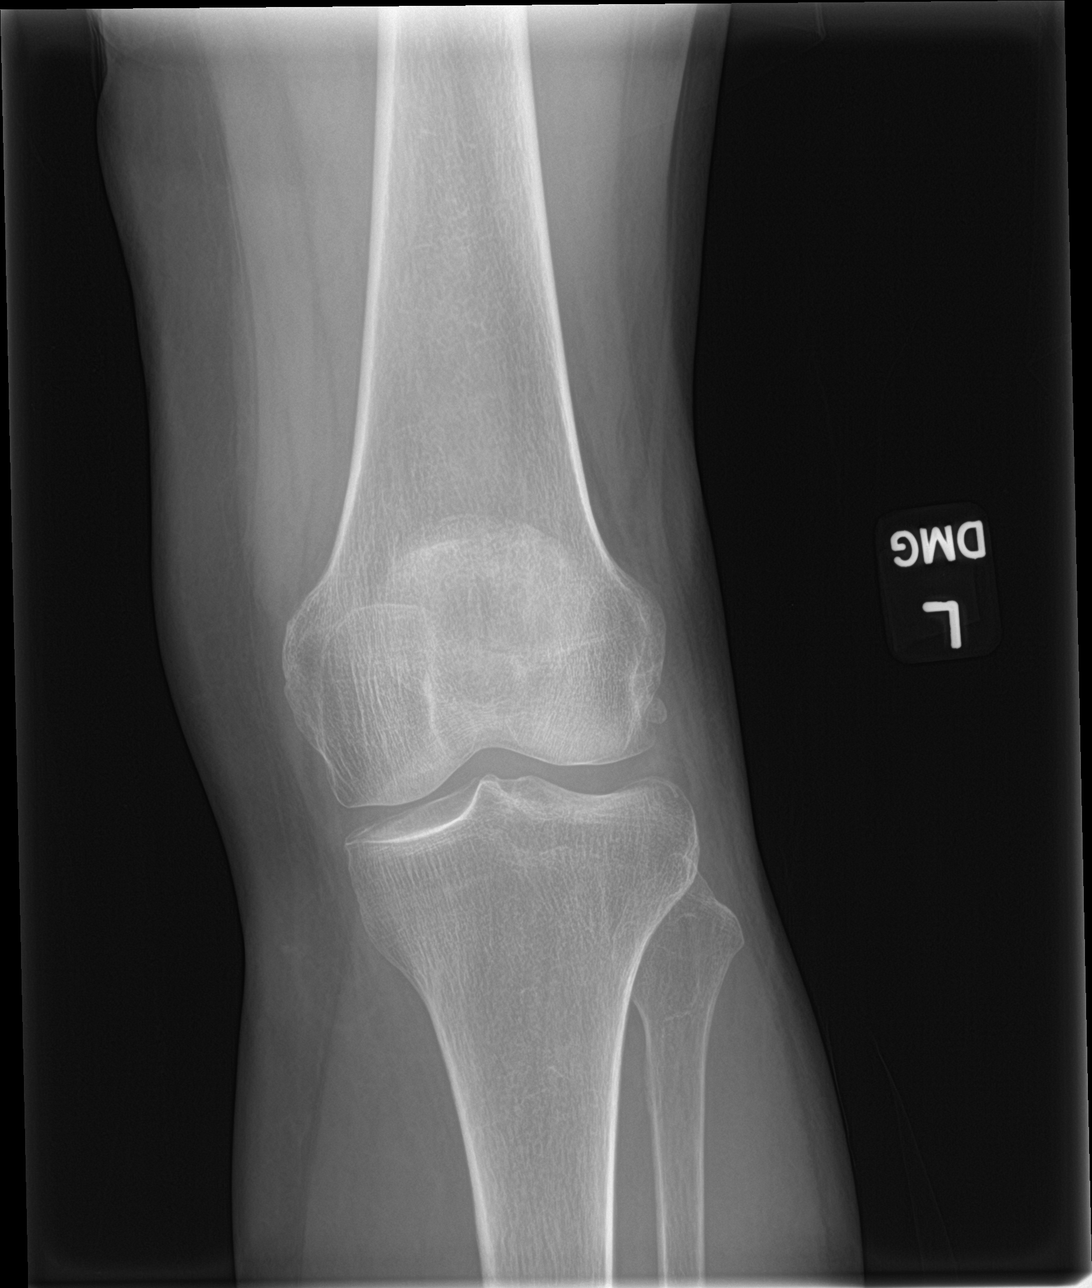

[knee ap (3 of 3)]
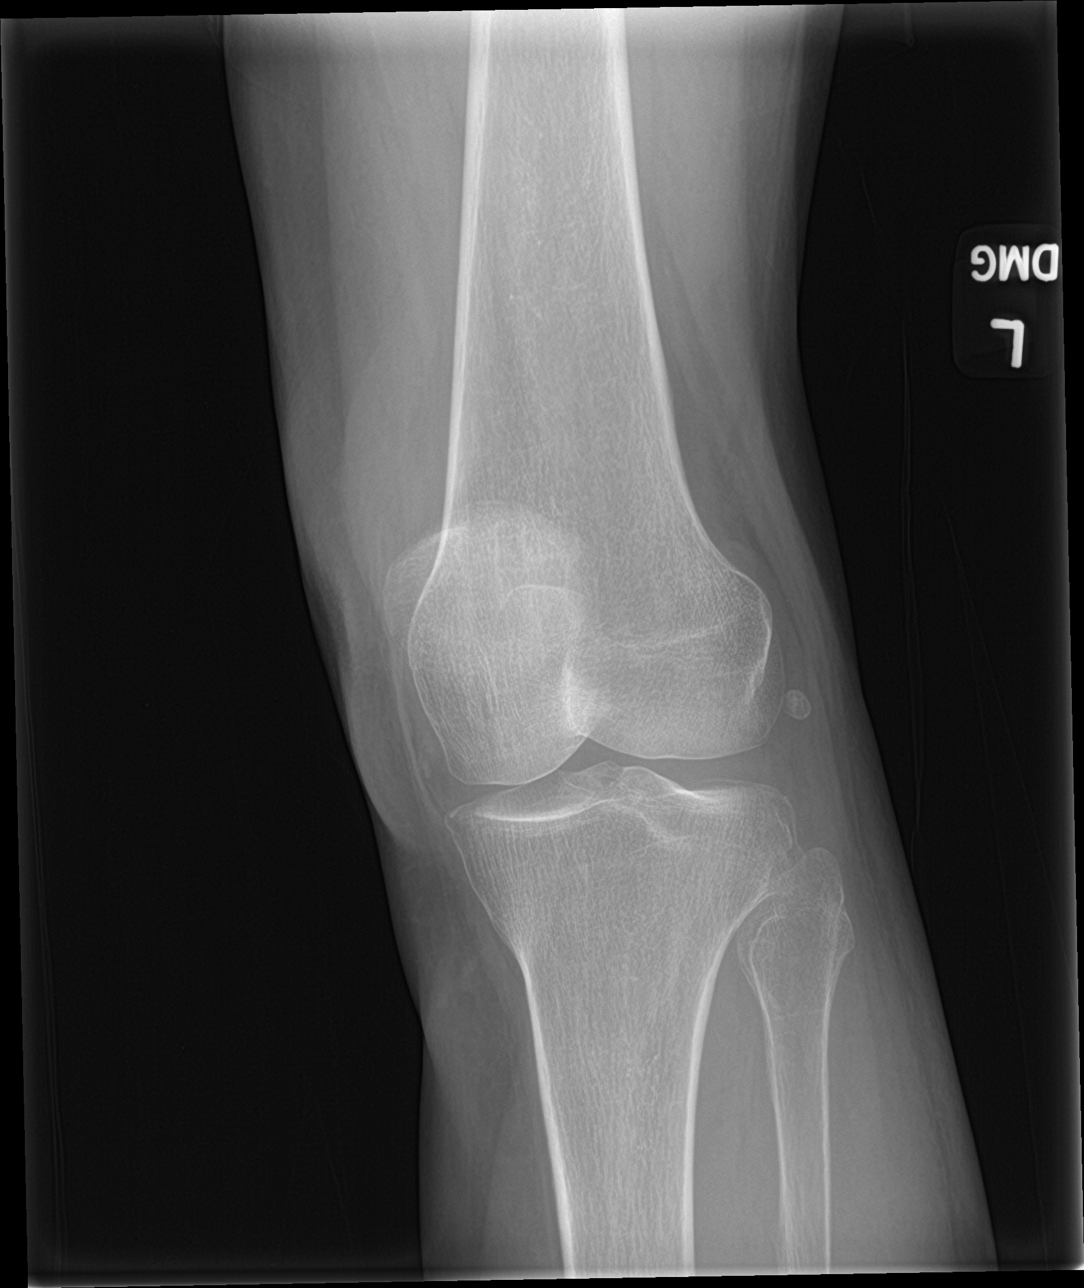

[4 of 4 positions shown; findings below may reference images not displayed]

FINDINGS: No evidence of fracture, dislocation, or joint effusion. No evidence
of arthropathy or other focal bone abnormality. Soft tissues are
unremarkable.
IMPRESSION: Negative.

## 2018-12-30 IMAGING — DX DG HIP (WITH OR WITHOUT PELVIS) 2-3V*L*
3 series · 3 of 3 positions shown · non-contrast
Comparison: None.

CLINICAL DATA: Left hip pain

EXAM:
DG HIP (WITH OR WITHOUT PELVIS) 2-3V LEFT

[pelvis ap]
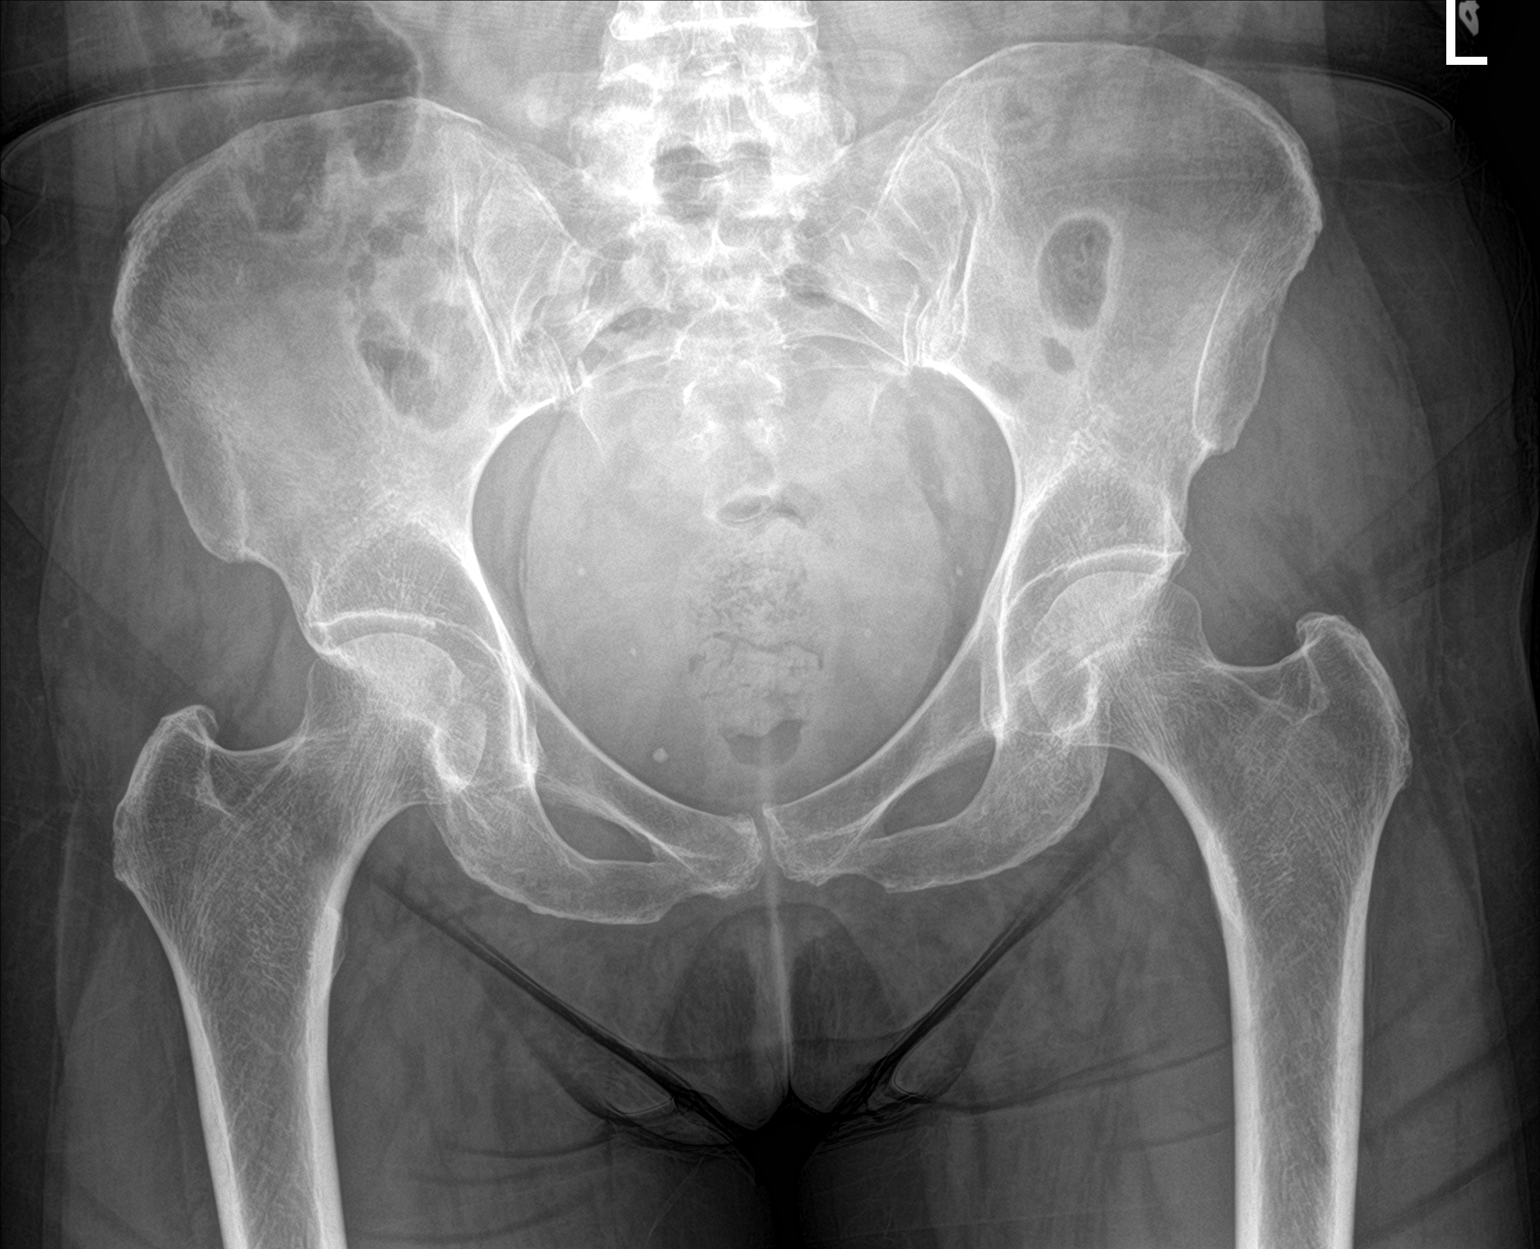

[hip ap]
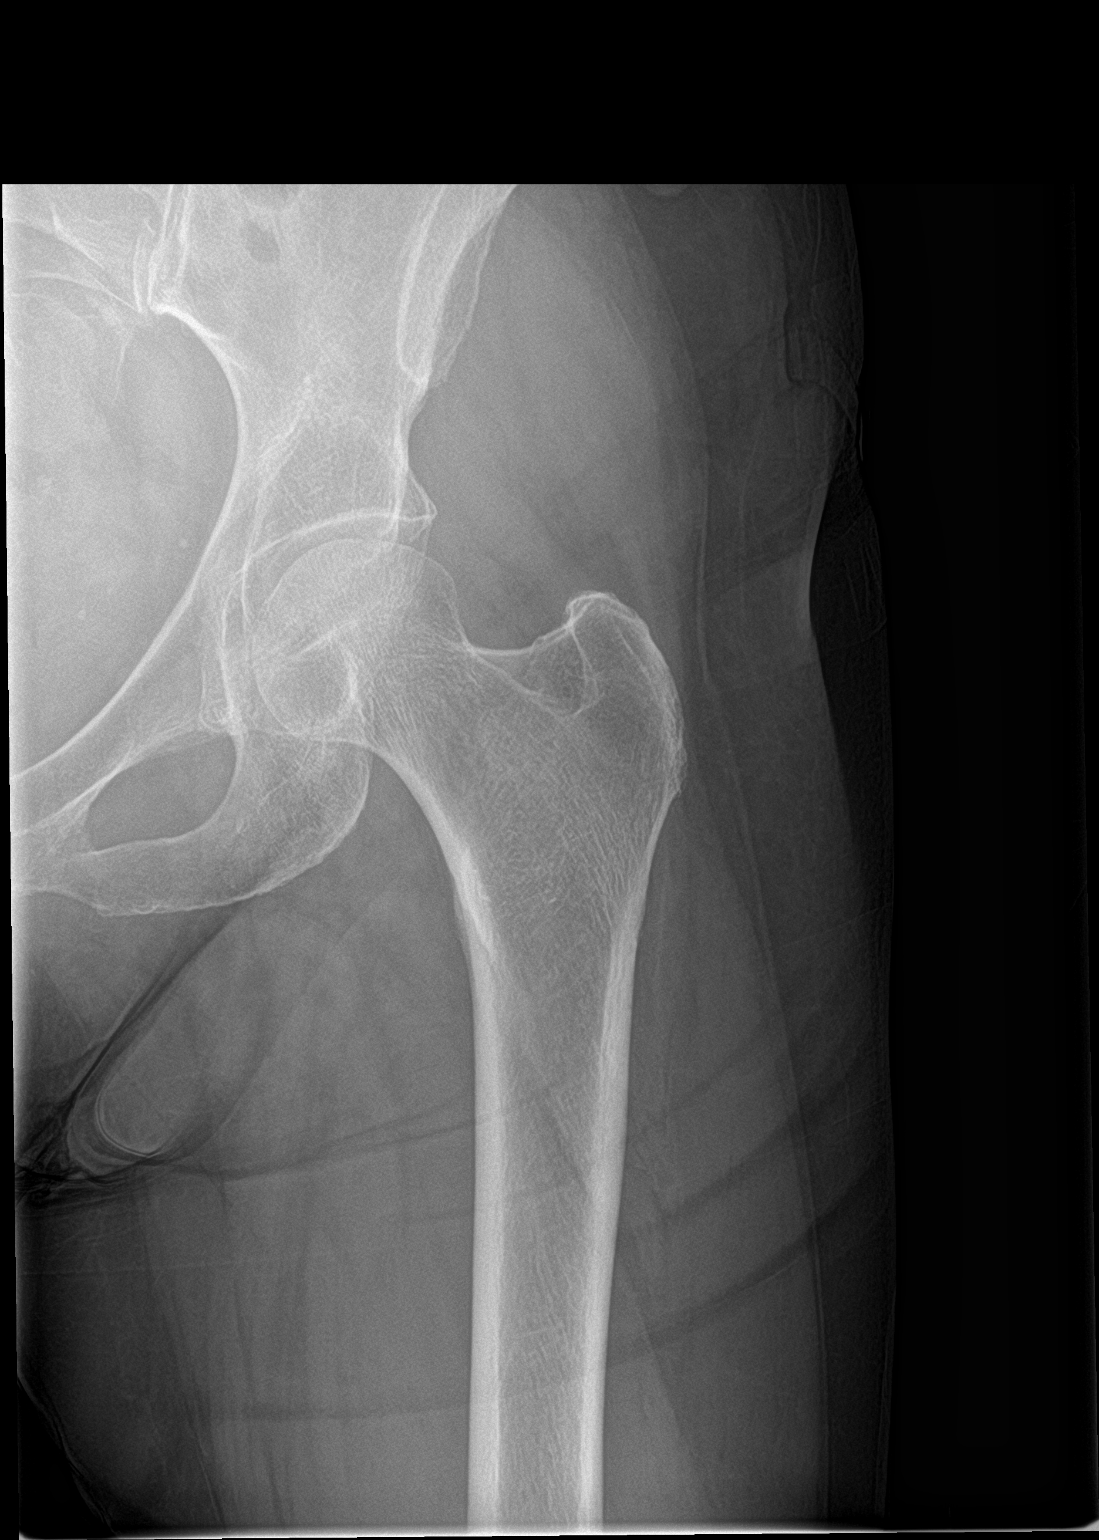

[hip lat]
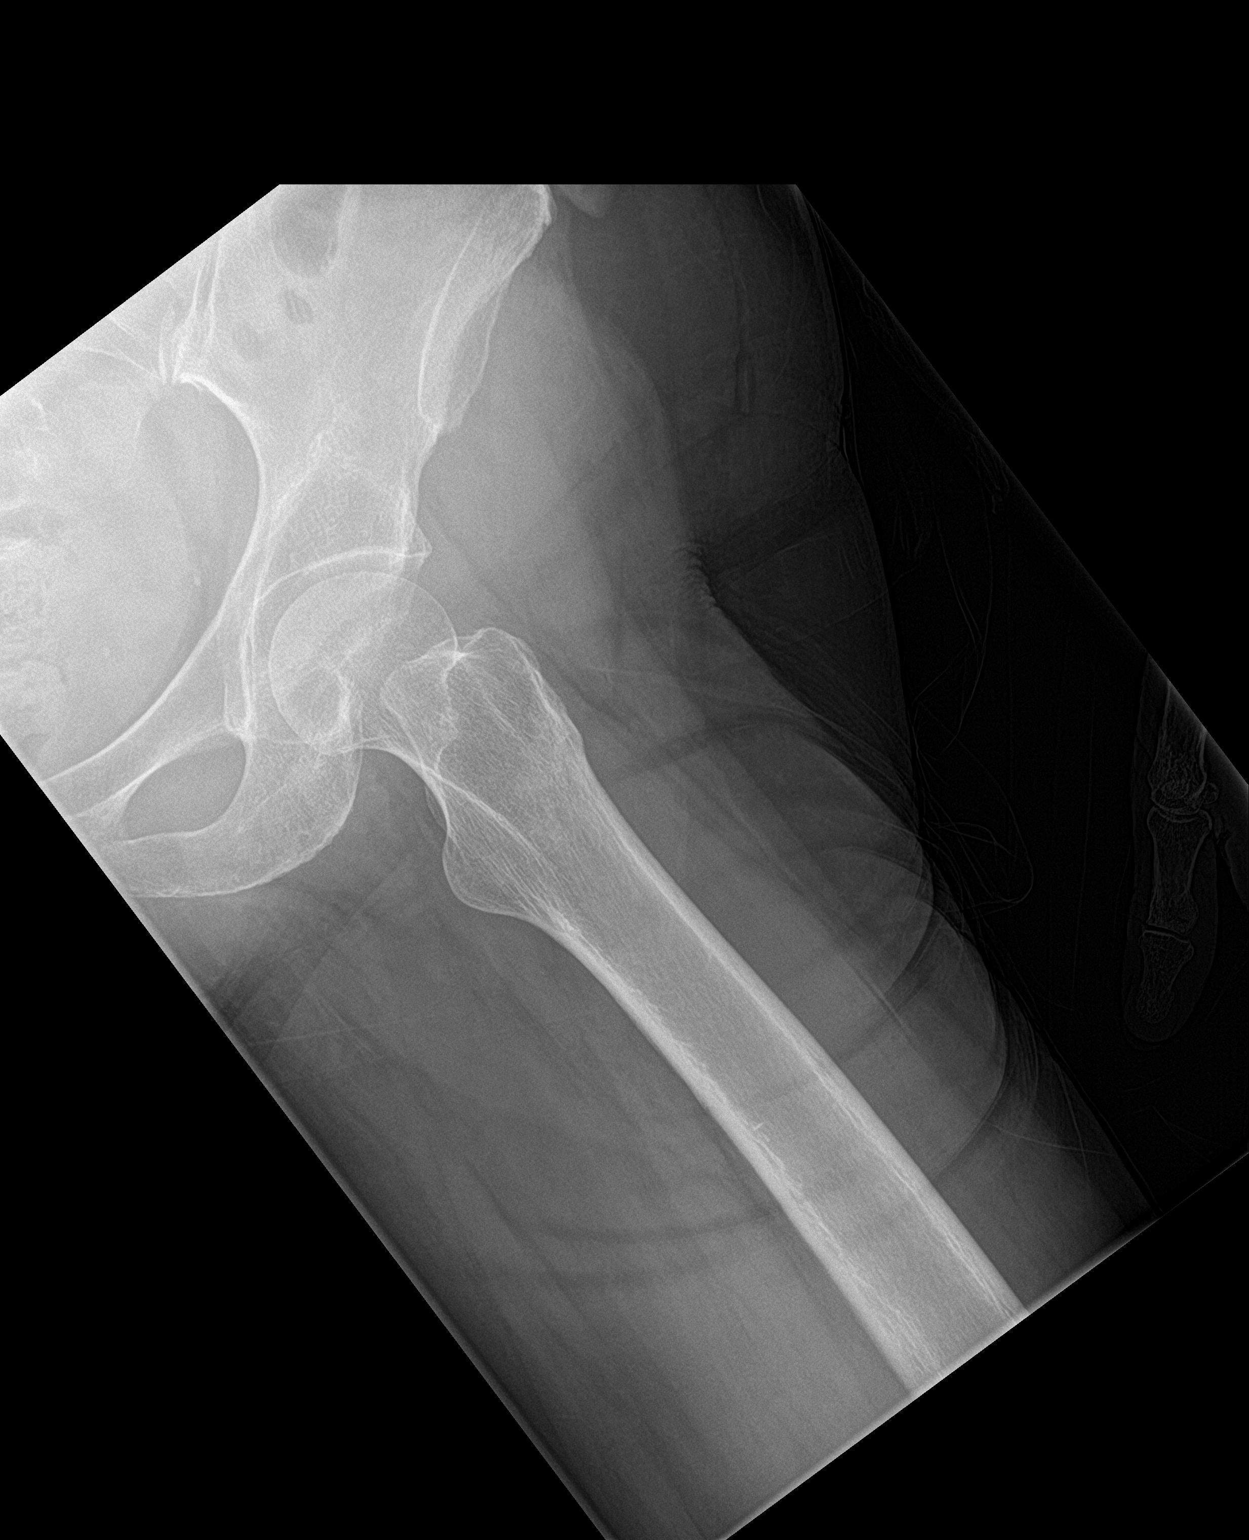

[3 of 3 positions shown; findings below may reference images not displayed]

FINDINGS: There is no evidence of hip fracture or dislocation. There is no
evidence of arthropathy or other focal bone abnormality.
IMPRESSION: Negative.

## 2019-01-19 DIAGNOSIS — E7800 Pure hypercholesterolemia, unspecified: Secondary | ICD-10-CM | POA: Diagnosis not present

## 2019-01-19 DIAGNOSIS — Z299 Encounter for prophylactic measures, unspecified: Secondary | ICD-10-CM | POA: Diagnosis not present

## 2019-01-19 DIAGNOSIS — G8929 Other chronic pain: Secondary | ICD-10-CM | POA: Diagnosis not present

## 2019-01-19 DIAGNOSIS — Z6825 Body mass index (BMI) 25.0-25.9, adult: Secondary | ICD-10-CM | POA: Diagnosis not present

## 2019-01-19 DIAGNOSIS — Z713 Dietary counseling and surveillance: Secondary | ICD-10-CM | POA: Diagnosis not present

## 2019-02-03 DIAGNOSIS — Z299 Encounter for prophylactic measures, unspecified: Secondary | ICD-10-CM | POA: Diagnosis not present

## 2019-02-03 DIAGNOSIS — Z7189 Other specified counseling: Secondary | ICD-10-CM | POA: Diagnosis not present

## 2019-02-03 DIAGNOSIS — Z1339 Encounter for screening examination for other mental health and behavioral disorders: Secondary | ICD-10-CM | POA: Diagnosis not present

## 2019-02-03 DIAGNOSIS — Z6825 Body mass index (BMI) 25.0-25.9, adult: Secondary | ICD-10-CM | POA: Diagnosis not present

## 2019-02-03 DIAGNOSIS — R5383 Other fatigue: Secondary | ICD-10-CM | POA: Diagnosis not present

## 2019-02-03 DIAGNOSIS — E7800 Pure hypercholesterolemia, unspecified: Secondary | ICD-10-CM | POA: Diagnosis not present

## 2019-02-03 DIAGNOSIS — Z1211 Encounter for screening for malignant neoplasm of colon: Secondary | ICD-10-CM | POA: Diagnosis not present

## 2019-02-03 DIAGNOSIS — Z1331 Encounter for screening for depression: Secondary | ICD-10-CM | POA: Diagnosis not present

## 2019-02-03 DIAGNOSIS — Z79899 Other long term (current) drug therapy: Secondary | ICD-10-CM | POA: Diagnosis not present

## 2019-02-03 DIAGNOSIS — Z0000 Encounter for general adult medical examination without abnormal findings: Secondary | ICD-10-CM | POA: Diagnosis not present

## 2019-02-24 DIAGNOSIS — S2341XA Sprain of ribs, initial encounter: Secondary | ICD-10-CM | POA: Diagnosis not present

## 2019-02-24 DIAGNOSIS — M9902 Segmental and somatic dysfunction of thoracic region: Secondary | ICD-10-CM | POA: Diagnosis not present

## 2019-02-25 DIAGNOSIS — M9902 Segmental and somatic dysfunction of thoracic region: Secondary | ICD-10-CM | POA: Diagnosis not present

## 2019-02-25 DIAGNOSIS — S2341XA Sprain of ribs, initial encounter: Secondary | ICD-10-CM | POA: Diagnosis not present

## 2019-03-23 DIAGNOSIS — E7800 Pure hypercholesterolemia, unspecified: Secondary | ICD-10-CM | POA: Diagnosis not present

## 2019-03-23 DIAGNOSIS — M159 Polyosteoarthritis, unspecified: Secondary | ICD-10-CM | POA: Diagnosis not present

## 2019-03-24 DIAGNOSIS — E7800 Pure hypercholesterolemia, unspecified: Secondary | ICD-10-CM | POA: Diagnosis not present

## 2019-03-24 DIAGNOSIS — G8929 Other chronic pain: Secondary | ICD-10-CM | POA: Diagnosis not present

## 2019-03-24 DIAGNOSIS — Z6825 Body mass index (BMI) 25.0-25.9, adult: Secondary | ICD-10-CM | POA: Diagnosis not present

## 2019-03-24 DIAGNOSIS — Z299 Encounter for prophylactic measures, unspecified: Secondary | ICD-10-CM | POA: Diagnosis not present

## 2019-03-24 DIAGNOSIS — Z713 Dietary counseling and surveillance: Secondary | ICD-10-CM | POA: Diagnosis not present

## 2019-04-23 DIAGNOSIS — G8929 Other chronic pain: Secondary | ICD-10-CM | POA: Diagnosis not present

## 2019-04-23 DIAGNOSIS — F329 Major depressive disorder, single episode, unspecified: Secondary | ICD-10-CM | POA: Diagnosis not present

## 2019-04-23 DIAGNOSIS — Z299 Encounter for prophylactic measures, unspecified: Secondary | ICD-10-CM | POA: Diagnosis not present

## 2019-04-23 DIAGNOSIS — Z713 Dietary counseling and surveillance: Secondary | ICD-10-CM | POA: Diagnosis not present

## 2019-04-23 DIAGNOSIS — Z6825 Body mass index (BMI) 25.0-25.9, adult: Secondary | ICD-10-CM | POA: Diagnosis not present

## 2019-05-21 DIAGNOSIS — E7800 Pure hypercholesterolemia, unspecified: Secondary | ICD-10-CM | POA: Diagnosis not present

## 2019-05-21 DIAGNOSIS — M159 Polyosteoarthritis, unspecified: Secondary | ICD-10-CM | POA: Diagnosis not present

## 2019-05-24 DIAGNOSIS — Z713 Dietary counseling and surveillance: Secondary | ICD-10-CM | POA: Diagnosis not present

## 2019-05-24 DIAGNOSIS — M542 Cervicalgia: Secondary | ICD-10-CM | POA: Diagnosis not present

## 2019-05-24 DIAGNOSIS — Z6825 Body mass index (BMI) 25.0-25.9, adult: Secondary | ICD-10-CM | POA: Diagnosis not present

## 2019-05-24 DIAGNOSIS — Z299 Encounter for prophylactic measures, unspecified: Secondary | ICD-10-CM | POA: Diagnosis not present

## 2019-05-26 DIAGNOSIS — Z03818 Encounter for observation for suspected exposure to other biological agents ruled out: Secondary | ICD-10-CM | POA: Diagnosis not present

## 2019-06-23 DIAGNOSIS — Z6824 Body mass index (BMI) 24.0-24.9, adult: Secondary | ICD-10-CM | POA: Diagnosis not present

## 2019-06-23 DIAGNOSIS — Z713 Dietary counseling and surveillance: Secondary | ICD-10-CM | POA: Diagnosis not present

## 2019-06-23 DIAGNOSIS — M545 Low back pain: Secondary | ICD-10-CM | POA: Diagnosis not present

## 2019-06-23 DIAGNOSIS — Z299 Encounter for prophylactic measures, unspecified: Secondary | ICD-10-CM | POA: Diagnosis not present

## 2019-07-23 DIAGNOSIS — E7800 Pure hypercholesterolemia, unspecified: Secondary | ICD-10-CM | POA: Diagnosis not present

## 2019-07-23 DIAGNOSIS — Z6825 Body mass index (BMI) 25.0-25.9, adult: Secondary | ICD-10-CM | POA: Diagnosis not present

## 2019-07-23 DIAGNOSIS — Z713 Dietary counseling and surveillance: Secondary | ICD-10-CM | POA: Diagnosis not present

## 2019-07-23 DIAGNOSIS — Z299 Encounter for prophylactic measures, unspecified: Secondary | ICD-10-CM | POA: Diagnosis not present

## 2019-07-23 DIAGNOSIS — G8929 Other chronic pain: Secondary | ICD-10-CM | POA: Diagnosis not present

## 2019-08-09 ENCOUNTER — Other Ambulatory Visit: Payer: Self-pay

## 2019-08-09 ENCOUNTER — Ambulatory Visit: Payer: Medicare Other | Attending: Internal Medicine

## 2019-08-09 DIAGNOSIS — Z20822 Contact with and (suspected) exposure to covid-19: Secondary | ICD-10-CM

## 2019-08-10 LAB — NOVEL CORONAVIRUS, NAA: SARS-CoV-2, NAA: DETECTED — AB

## 2019-08-20 DIAGNOSIS — Z299 Encounter for prophylactic measures, unspecified: Secondary | ICD-10-CM | POA: Diagnosis not present

## 2019-08-20 DIAGNOSIS — M797 Fibromyalgia: Secondary | ICD-10-CM | POA: Diagnosis not present

## 2019-08-20 DIAGNOSIS — U071 COVID-19: Secondary | ICD-10-CM | POA: Diagnosis not present

## 2019-08-20 DIAGNOSIS — Z6824 Body mass index (BMI) 24.0-24.9, adult: Secondary | ICD-10-CM | POA: Diagnosis not present

## 2019-08-20 DIAGNOSIS — Z2821 Immunization not carried out because of patient refusal: Secondary | ICD-10-CM | POA: Diagnosis not present

## 2019-08-20 DIAGNOSIS — Z87891 Personal history of nicotine dependence: Secondary | ICD-10-CM | POA: Diagnosis not present

## 2019-09-21 DIAGNOSIS — G8929 Other chronic pain: Secondary | ICD-10-CM | POA: Diagnosis not present

## 2019-09-21 DIAGNOSIS — Z6824 Body mass index (BMI) 24.0-24.9, adult: Secondary | ICD-10-CM | POA: Diagnosis not present

## 2019-09-21 DIAGNOSIS — Z299 Encounter for prophylactic measures, unspecified: Secondary | ICD-10-CM | POA: Diagnosis not present

## 2019-09-21 DIAGNOSIS — M79609 Pain in unspecified limb: Secondary | ICD-10-CM | POA: Diagnosis not present

## 2019-09-21 DIAGNOSIS — M797 Fibromyalgia: Secondary | ICD-10-CM | POA: Diagnosis not present

## 2019-09-21 DIAGNOSIS — Z87891 Personal history of nicotine dependence: Secondary | ICD-10-CM | POA: Diagnosis not present

## 2019-10-20 DIAGNOSIS — M797 Fibromyalgia: Secondary | ICD-10-CM | POA: Diagnosis not present

## 2019-10-20 DIAGNOSIS — F17210 Nicotine dependence, cigarettes, uncomplicated: Secondary | ICD-10-CM | POA: Diagnosis not present

## 2019-10-20 DIAGNOSIS — E7800 Pure hypercholesterolemia, unspecified: Secondary | ICD-10-CM | POA: Diagnosis not present

## 2019-10-20 DIAGNOSIS — Z299 Encounter for prophylactic measures, unspecified: Secondary | ICD-10-CM | POA: Diagnosis not present

## 2019-10-25 DIAGNOSIS — L570 Actinic keratosis: Secondary | ICD-10-CM | POA: Diagnosis not present

## 2019-10-25 DIAGNOSIS — Z85828 Personal history of other malignant neoplasm of skin: Secondary | ICD-10-CM | POA: Diagnosis not present

## 2019-10-25 DIAGNOSIS — L814 Other melanin hyperpigmentation: Secondary | ICD-10-CM | POA: Diagnosis not present

## 2019-10-25 DIAGNOSIS — L309 Dermatitis, unspecified: Secondary | ICD-10-CM | POA: Diagnosis not present

## 2019-10-25 DIAGNOSIS — I781 Nevus, non-neoplastic: Secondary | ICD-10-CM | POA: Diagnosis not present

## 2019-11-18 DIAGNOSIS — M797 Fibromyalgia: Secondary | ICD-10-CM | POA: Diagnosis not present

## 2019-11-18 DIAGNOSIS — Z79899 Other long term (current) drug therapy: Secondary | ICD-10-CM | POA: Diagnosis not present

## 2019-11-18 DIAGNOSIS — Z299 Encounter for prophylactic measures, unspecified: Secondary | ICD-10-CM | POA: Diagnosis not present

## 2019-11-18 DIAGNOSIS — B029 Zoster without complications: Secondary | ICD-10-CM | POA: Diagnosis not present

## 2019-12-16 DIAGNOSIS — Z299 Encounter for prophylactic measures, unspecified: Secondary | ICD-10-CM | POA: Diagnosis not present

## 2019-12-16 DIAGNOSIS — Z87891 Personal history of nicotine dependence: Secondary | ICD-10-CM | POA: Diagnosis not present

## 2019-12-16 DIAGNOSIS — M797 Fibromyalgia: Secondary | ICD-10-CM | POA: Diagnosis not present

## 2019-12-16 DIAGNOSIS — G8929 Other chronic pain: Secondary | ICD-10-CM | POA: Diagnosis not present

## 2020-01-19 DIAGNOSIS — Z299 Encounter for prophylactic measures, unspecified: Secondary | ICD-10-CM | POA: Diagnosis not present

## 2020-01-19 DIAGNOSIS — M797 Fibromyalgia: Secondary | ICD-10-CM | POA: Diagnosis not present

## 2020-01-19 DIAGNOSIS — F17210 Nicotine dependence, cigarettes, uncomplicated: Secondary | ICD-10-CM | POA: Diagnosis not present

## 2020-02-04 DIAGNOSIS — E559 Vitamin D deficiency, unspecified: Secondary | ICD-10-CM | POA: Diagnosis not present

## 2020-02-04 DIAGNOSIS — E7800 Pure hypercholesterolemia, unspecified: Secondary | ICD-10-CM | POA: Diagnosis not present

## 2020-02-04 DIAGNOSIS — Z7189 Other specified counseling: Secondary | ICD-10-CM | POA: Diagnosis not present

## 2020-02-04 DIAGNOSIS — Z6824 Body mass index (BMI) 24.0-24.9, adult: Secondary | ICD-10-CM | POA: Diagnosis not present

## 2020-02-04 DIAGNOSIS — Z1339 Encounter for screening examination for other mental health and behavioral disorders: Secondary | ICD-10-CM | POA: Diagnosis not present

## 2020-02-04 DIAGNOSIS — Z299 Encounter for prophylactic measures, unspecified: Secondary | ICD-10-CM | POA: Diagnosis not present

## 2020-02-04 DIAGNOSIS — Z79899 Other long term (current) drug therapy: Secondary | ICD-10-CM | POA: Diagnosis not present

## 2020-02-04 DIAGNOSIS — Z1211 Encounter for screening for malignant neoplasm of colon: Secondary | ICD-10-CM | POA: Diagnosis not present

## 2020-02-04 DIAGNOSIS — Z1331 Encounter for screening for depression: Secondary | ICD-10-CM | POA: Diagnosis not present

## 2020-02-04 DIAGNOSIS — Z0000 Encounter for general adult medical examination without abnormal findings: Secondary | ICD-10-CM | POA: Diagnosis not present

## 2020-02-04 DIAGNOSIS — R5383 Other fatigue: Secondary | ICD-10-CM | POA: Diagnosis not present

## 2020-03-17 DIAGNOSIS — Z299 Encounter for prophylactic measures, unspecified: Secondary | ICD-10-CM | POA: Diagnosis not present

## 2020-03-17 DIAGNOSIS — M797 Fibromyalgia: Secondary | ICD-10-CM | POA: Diagnosis not present

## 2020-04-17 DIAGNOSIS — G8929 Other chronic pain: Secondary | ICD-10-CM | POA: Diagnosis not present

## 2020-04-17 DIAGNOSIS — Z299 Encounter for prophylactic measures, unspecified: Secondary | ICD-10-CM | POA: Diagnosis not present

## 2020-04-17 DIAGNOSIS — M797 Fibromyalgia: Secondary | ICD-10-CM | POA: Diagnosis not present

## 2020-04-17 DIAGNOSIS — E7800 Pure hypercholesterolemia, unspecified: Secondary | ICD-10-CM | POA: Diagnosis not present

## 2020-05-01 DIAGNOSIS — L570 Actinic keratosis: Secondary | ICD-10-CM | POA: Diagnosis not present

## 2020-05-17 DIAGNOSIS — Z299 Encounter for prophylactic measures, unspecified: Secondary | ICD-10-CM | POA: Diagnosis not present

## 2020-05-17 DIAGNOSIS — M797 Fibromyalgia: Secondary | ICD-10-CM | POA: Diagnosis not present

## 2020-05-17 DIAGNOSIS — G8929 Other chronic pain: Secondary | ICD-10-CM | POA: Diagnosis not present

## 2020-06-16 DIAGNOSIS — M797 Fibromyalgia: Secondary | ICD-10-CM | POA: Diagnosis not present

## 2020-06-16 DIAGNOSIS — E7800 Pure hypercholesterolemia, unspecified: Secondary | ICD-10-CM | POA: Diagnosis not present

## 2020-06-16 DIAGNOSIS — Z299 Encounter for prophylactic measures, unspecified: Secondary | ICD-10-CM | POA: Diagnosis not present

## 2020-06-16 DIAGNOSIS — G8929 Other chronic pain: Secondary | ICD-10-CM | POA: Diagnosis not present

## 2020-07-10 DIAGNOSIS — L570 Actinic keratosis: Secondary | ICD-10-CM | POA: Diagnosis not present

## 2020-07-10 DIAGNOSIS — L814 Other melanin hyperpigmentation: Secondary | ICD-10-CM | POA: Diagnosis not present

## 2020-07-10 DIAGNOSIS — L218 Other seborrheic dermatitis: Secondary | ICD-10-CM | POA: Diagnosis not present

## 2020-07-12 DIAGNOSIS — E7800 Pure hypercholesterolemia, unspecified: Secondary | ICD-10-CM | POA: Diagnosis not present

## 2020-07-12 DIAGNOSIS — Z6826 Body mass index (BMI) 26.0-26.9, adult: Secondary | ICD-10-CM | POA: Diagnosis not present

## 2020-07-12 DIAGNOSIS — Z713 Dietary counseling and surveillance: Secondary | ICD-10-CM | POA: Diagnosis not present

## 2020-07-12 DIAGNOSIS — Z299 Encounter for prophylactic measures, unspecified: Secondary | ICD-10-CM | POA: Diagnosis not present

## 2020-07-12 DIAGNOSIS — M797 Fibromyalgia: Secondary | ICD-10-CM | POA: Diagnosis not present

## 2020-08-12 DIAGNOSIS — R070 Pain in throat: Secondary | ICD-10-CM | POA: Diagnosis not present

## 2020-08-12 DIAGNOSIS — U071 COVID-19: Secondary | ICD-10-CM | POA: Diagnosis not present

## 2020-08-12 DIAGNOSIS — Z20822 Contact with and (suspected) exposure to covid-19: Secondary | ICD-10-CM | POA: Diagnosis not present

## 2020-08-12 DIAGNOSIS — M791 Myalgia, unspecified site: Secondary | ICD-10-CM | POA: Diagnosis not present

## 2020-08-15 DIAGNOSIS — Z87891 Personal history of nicotine dependence: Secondary | ICD-10-CM | POA: Diagnosis not present

## 2020-08-15 DIAGNOSIS — U071 COVID-19: Secondary | ICD-10-CM | POA: Diagnosis not present

## 2020-08-15 DIAGNOSIS — K219 Gastro-esophageal reflux disease without esophagitis: Secondary | ICD-10-CM | POA: Diagnosis not present

## 2020-08-15 DIAGNOSIS — E7800 Pure hypercholesterolemia, unspecified: Secondary | ICD-10-CM | POA: Diagnosis not present

## 2020-08-15 DIAGNOSIS — G8929 Other chronic pain: Secondary | ICD-10-CM | POA: Diagnosis not present

## 2020-08-15 DIAGNOSIS — Z6826 Body mass index (BMI) 26.0-26.9, adult: Secondary | ICD-10-CM | POA: Diagnosis not present

## 2020-08-15 DIAGNOSIS — Z299 Encounter for prophylactic measures, unspecified: Secondary | ICD-10-CM | POA: Diagnosis not present

## 2020-09-13 DIAGNOSIS — G8929 Other chronic pain: Secondary | ICD-10-CM | POA: Diagnosis not present

## 2020-09-13 DIAGNOSIS — Z299 Encounter for prophylactic measures, unspecified: Secondary | ICD-10-CM | POA: Diagnosis not present

## 2020-09-13 DIAGNOSIS — Z87891 Personal history of nicotine dependence: Secondary | ICD-10-CM | POA: Diagnosis not present

## 2020-09-13 DIAGNOSIS — M797 Fibromyalgia: Secondary | ICD-10-CM | POA: Diagnosis not present

## 2020-09-13 DIAGNOSIS — Z79899 Other long term (current) drug therapy: Secondary | ICD-10-CM | POA: Diagnosis not present

## 2020-10-11 DIAGNOSIS — Z2821 Immunization not carried out because of patient refusal: Secondary | ICD-10-CM | POA: Diagnosis not present

## 2020-10-11 DIAGNOSIS — Z299 Encounter for prophylactic measures, unspecified: Secondary | ICD-10-CM | POA: Diagnosis not present

## 2020-10-11 DIAGNOSIS — Z79899 Other long term (current) drug therapy: Secondary | ICD-10-CM | POA: Diagnosis not present

## 2020-10-11 DIAGNOSIS — M797 Fibromyalgia: Secondary | ICD-10-CM | POA: Diagnosis not present

## 2020-10-11 DIAGNOSIS — Z87891 Personal history of nicotine dependence: Secondary | ICD-10-CM | POA: Diagnosis not present

## 2020-11-07 DIAGNOSIS — M797 Fibromyalgia: Secondary | ICD-10-CM | POA: Diagnosis not present

## 2020-11-07 DIAGNOSIS — E7800 Pure hypercholesterolemia, unspecified: Secondary | ICD-10-CM | POA: Diagnosis not present

## 2020-11-07 DIAGNOSIS — Z299 Encounter for prophylactic measures, unspecified: Secondary | ICD-10-CM | POA: Diagnosis not present

## 2020-11-07 DIAGNOSIS — Z79899 Other long term (current) drug therapy: Secondary | ICD-10-CM | POA: Diagnosis not present

## 2020-11-15 DIAGNOSIS — M7552 Bursitis of left shoulder: Secondary | ICD-10-CM | POA: Diagnosis not present

## 2020-11-15 DIAGNOSIS — Z87891 Personal history of nicotine dependence: Secondary | ICD-10-CM | POA: Diagnosis not present

## 2020-11-15 DIAGNOSIS — Z299 Encounter for prophylactic measures, unspecified: Secondary | ICD-10-CM | POA: Diagnosis not present

## 2020-11-30 ENCOUNTER — Ambulatory Visit (INDEPENDENT_AMBULATORY_CARE_PROVIDER_SITE_OTHER): Payer: PPO | Admitting: Orthopaedic Surgery

## 2020-11-30 ENCOUNTER — Ambulatory Visit: Payer: Self-pay

## 2020-11-30 ENCOUNTER — Other Ambulatory Visit: Payer: Self-pay

## 2020-11-30 DIAGNOSIS — M7542 Impingement syndrome of left shoulder: Secondary | ICD-10-CM | POA: Diagnosis not present

## 2020-11-30 DIAGNOSIS — M25512 Pain in left shoulder: Secondary | ICD-10-CM

## 2020-11-30 MED ORDER — METHYLPREDNISOLONE ACETATE 40 MG/ML IJ SUSP
40.0000 mg | INTRAMUSCULAR | Status: AC | PRN
  Administered 2020-11-30: 40 mg via INTRA_ARTICULAR

## 2020-11-30 MED ORDER — BUPIVACAINE HCL 0.25 % IJ SOLN
4.0000 mL | INTRAMUSCULAR | Status: AC | PRN
  Administered 2020-11-30: 4 mL via INTRA_ARTICULAR

## 2020-11-30 MED ORDER — LIDOCAINE HCL 1 % IJ SOLN
0.5000 mL | INTRAMUSCULAR | Status: AC | PRN
  Administered 2020-11-30: .5 mL

## 2020-11-30 NOTE — Progress Notes (Signed)
Office Visit Note   Patient: Haley Caldwell           Date of Birth: [birth year]           MRN: [id number] Visit Date: 11/30/2020              Requested by: Monico Blitz, MD [address] [place],  [place] [address] PCP: Monico Blitz, MD   Assessment & Plan: Visit Diagnoses:  1. Acute pain of left shoulder     Plan: Lateral subacromial injection performed she still had pain with abduction pain with resisted supraspinatus then difficulty getting her arm up overhead but when she had it up she had good good strength.  Patient may have small full-thickness rotator cuff tear.  We will see how she does and I plan to recheck her in 4 weeks.  Follow-Up Instructions: Return in about 4 weeks (around 12/28/2020).   Orders:  Orders Placed This Encounter  Procedures  . Large Joint Inj  . XR Shoulder Left   No orders of the defined types were placed in this encounter.     Procedures: Large Joint Inj: L subacromial bursa on 11/30/2020 9:42 AM Indications: pain Details: 22 G 1.5 in needle  Arthrogram: No  Medications: 4 mL bupivacaine 0.25 %; 40 mg methylPREDNISolone acetate 40 MG/ML; 0.5 mL lidocaine 1 % Outcome: tolerated well, no immediate complications Procedure, treatment alternatives, risks and benefits explained, specific risks discussed. Consent was given by the patient. Immediately prior to procedure a time out was called to verify the correct patient, procedure, equipment, support staff and site/side marked as required. Patient was prepped and draped in the usual sterile fashion.       Clinical Data: No additional findings.   Subjective: Chief Complaint  Patient presents with  . Left Shoulder - Pain    HPI 60 year old female referred: Patients with left shoulder pain onset at the end of March when she was doing some raking doing yard work.  No past history of problems with her shoulder she does have some problems with fibromyalgia has been on some low-dose chronic pain  management Norco 7.5 averaging 2 tablets a day for years.  She is right-hand dominant she has had pain with outstretch reaching left shoulder overhead activity fixing her hair.  No history of diabetes non-smoker negative for hypertension.  She denies numbness or tingling in her fingers sometimes the pain radiates down toward her elbows.  She states she had a posterior injection in her shoulder and did not seem to improve her symptoms.  Review of Systems all other systems are noncontributory to HPI.   Objective: Vital Signs: There were no vitals taken for this visit.  Physical Exam Constitutional:      Appearance: She is well-developed.  HENT:     Head: Normocephalic.     Right Ear: External ear normal.     Left Ear: External ear normal.  Eyes:     Pupils: Pupils are equal, round, and reactive to light.  Neck:     Thyroid: No thyromegaly.     Trachea: No tracheal deviation.  Cardiovascular:     Rate and Rhythm: Normal rate.  Pulmonary:     Effort: Pulmonary effort is normal.  Abdominal:     Palpations: Abdomen is soft.  Skin:    General: Skin is warm and dry.  Neurological:     Mental Status: She is alert and oriented to person, place, and time.  Psychiatric:  Behavior: Behavior normal.     Ortho Exam mild tenderness along his biceps.  Pain with resisted abduction positive Neer negative Hawkins no brachial plexus tenderness negative Spurling upper extremity reflexes 2+ and symmetrical normal heel toe gait.  No lower extremity clonus.  Elbow has full extension and flexion.  No subluxation of the left or right shoulder. Specialty Comments:  No specialty comments available.  Imaging: No results found.   PMFS History: There are no problems to display for this patient.  Past Medical History:  Diagnosis Date  . Anxiety    social anxiety  . Fibromyalgia   . H/O degenerative disc disease   . History of kidney stones   . Hyperlipemia   . Sleep apnea     Family  History  Problem Relation Age of Onset  . Brain cancer Paternal Grandfather     Past Surgical History:  Procedure Laterality Date  . ABDOMINAL HYSTERECTOMY    . BREAST REDUCTION SURGERY    . STRABISMUS SURGERY Bilateral 12/06/2016   Procedure: BILATERAL STRABISMUS REPAIR;  Surgeon: Everitt Amber, MD;  Location: [facility];  Service: Ophthalmology;  Laterality: Bilateral;  . TONSILLECTOMY     Social History   Occupational History  . Not on file  Tobacco Use  . Smoking status: Former Research scientist (life sciences)  . Smokeless tobacco: Never Used  Vaping Use  . Vaping Use: Some days  Substance and Sexual Activity  . Alcohol use: No  . Drug use: No  . Sexual activity: Not on file

## 2020-12-12 DIAGNOSIS — M797 Fibromyalgia: Secondary | ICD-10-CM | POA: Diagnosis not present

## 2020-12-12 DIAGNOSIS — Z299 Encounter for prophylactic measures, unspecified: Secondary | ICD-10-CM | POA: Diagnosis not present

## 2020-12-21 ENCOUNTER — Encounter: Payer: Self-pay | Admitting: Orthopaedic Surgery

## 2020-12-21 ENCOUNTER — Ambulatory Visit (INDEPENDENT_AMBULATORY_CARE_PROVIDER_SITE_OTHER): Payer: PPO | Admitting: Orthopaedic Surgery

## 2020-12-21 ENCOUNTER — Other Ambulatory Visit: Payer: Self-pay

## 2020-12-21 VITALS — Ht 64.0 in | Wt 150.0 lb

## 2020-12-21 DIAGNOSIS — M7542 Impingement syndrome of left shoulder: Secondary | ICD-10-CM | POA: Diagnosis not present

## 2020-12-21 NOTE — Addendum Note (Signed)
Addended by: Meyer Cory on: 12/21/2020 10:37 AM   Modules accepted: Orders

## 2020-12-21 NOTE — Progress Notes (Signed)
Office Visit Note   Patient: Haley Caldwell           Date of Birth: 1961/02/25           MRN: [id number] Visit Date: 12/21/2020              Requested by: Monico Blitz, MD [address] [place],  [place] [address] PCP: Monico Blitz, MD   Assessment & Plan: Visit Diagnoses:  1. Impingement syndrome of left shoulder     Plan: Patient's had persistent impingement symptoms previous injection only gave her 2 weeks of relief and recurrence of symptoms.  We will proceed with MRI scan to evaluate her for small full-thickness tear versus partial tearing of the left rotator cuff.  I will follow-up after scan.  Follow-Up Instructions: No follow-ups on file.   Orders:  No orders of the defined types were placed in this encounter.  No orders of the defined types were placed in this encounter.     Procedures: No procedures performed   Clinical Data: No additional findings.   Subjective: Chief Complaint  Patient presents with  . Left Shoulder - Follow-up    HPI 60 year old female returns post injection left shoulder for impingement.  She states she got at least about 2 weeks of relief had good relief but then had recurrence of symptoms now her pain is 8 or 9 out of 10 and she notices with particular positions trying to abduct past 80 degrees she feels sharp pain.  Sometimes she is able to manipulate her shoulder bypassed the area.  She denies any numbness or tingling in her hand no associated neck pain.  No problems with her opposite right shoulder.  She is right-hand dominant.  Injection was 11/30/2020.  Review of Systems All the systems updated unchanged from 11/30/2020 office visit.  Objective: Vital Signs: Ht 5\' 4"  (1.626 m)   Wt 150 lb (68 kg)   BMI 25.75 kg/m   Physical Exam Constitutional:      Appearance: She is well-developed.  HENT:     Head: Normocephalic.     Right Ear: External ear normal.     Left Ear: External ear normal.  Eyes:     Pupils: Pupils are equal,  round, and reactive to light.  Neck:     Thyroid: No thyromegaly.     Trachea: No tracheal deviation.  Cardiovascular:     Rate and Rhythm: Normal rate.  Pulmonary:     Effort: Pulmonary effort is normal.  Abdominal:     Palpations: Abdomen is soft.  Skin:    General: Skin is warm and dry.  Neurological:     Mental Status: She is alert and oriented to person, place, and time.  Psychiatric:        Behavior: Behavior normal.     Ortho Exam negative impingement right shoulder.  Negative Spurling.  Long head of the biceps tendon left shoulder is normal.  Negative drop arm test positive impingement test left negative Hawkins positive Neer.  No supraspinatus atrophy.  She is able get her arm up overhead but she has manipulated and bypassed an area with pain with abduction with 80 to 90 degrees abduction.  Specialty Comments:  No specialty comments available.  Imaging: No results found.   PMFS History: Patient Active Problem List   Diagnosis Date Noted  . Impingement syndrome of left shoulder 11/30/2020   Past Medical History:  Diagnosis Date  . Anxiety    social anxiety  .  Fibromyalgia   . H/O degenerative disc disease   . History of kidney stones   . Hyperlipemia   . Sleep apnea     Family History  Problem Relation Age of Onset  . Brain cancer Paternal Grandfather     Past Surgical History:  Procedure Laterality Date  . ABDOMINAL HYSTERECTOMY    . BREAST REDUCTION SURGERY    . STRABISMUS SURGERY Bilateral 12/06/2016   Procedure: BILATERAL STRABISMUS REPAIR;  Surgeon: Everitt Amber, MD;  Location: [facility];  Service: Ophthalmology;  Laterality: Bilateral;  . TONSILLECTOMY     Social History   Occupational History  . Not on file  Tobacco Use  . Smoking status: Former Research scientist (life sciences)  . Smokeless tobacco: Never Used  Vaping Use  . Vaping Use: Some days  Substance and Sexual Activity  . Alcohol use: No  . Drug use: No  . Sexual activity: Not on file

## 2021-01-04 ENCOUNTER — Ambulatory Visit: Payer: PPO | Admitting: Orthopaedic Surgery

## 2021-01-04 ENCOUNTER — Other Ambulatory Visit: Payer: Self-pay

## 2021-01-05 ENCOUNTER — Other Ambulatory Visit (HOSPITAL_COMMUNITY): Payer: PPO

## 2021-01-09 ENCOUNTER — Ambulatory Visit (HOSPITAL_COMMUNITY)
Admission: RE | Admit: 2021-01-09 | Discharge: 2021-01-09 | Disposition: A | Discharge status: Home / Self Care | Payer: PPO | Source: Ambulatory Visit | Attending: Orthopaedic Surgery | Admitting: Orthopaedic Surgery

## 2021-01-09 DIAGNOSIS — M7542 Impingement syndrome of left shoulder: Secondary | ICD-10-CM | POA: Diagnosis not present

## 2021-01-09 DIAGNOSIS — M19012 Primary osteoarthritis, left shoulder: Secondary | ICD-10-CM | POA: Diagnosis not present

## 2021-01-11 DIAGNOSIS — M797 Fibromyalgia: Secondary | ICD-10-CM | POA: Diagnosis not present

## 2021-01-11 DIAGNOSIS — Z299 Encounter for prophylactic measures, unspecified: Secondary | ICD-10-CM | POA: Diagnosis not present

## 2021-01-11 DIAGNOSIS — M25512 Pain in left shoulder: Secondary | ICD-10-CM | POA: Diagnosis not present

## 2021-01-11 DIAGNOSIS — Z87891 Personal history of nicotine dependence: Secondary | ICD-10-CM | POA: Diagnosis not present

## 2021-01-11 DIAGNOSIS — Z6825 Body mass index (BMI) 25.0-25.9, adult: Secondary | ICD-10-CM | POA: Diagnosis not present

## 2021-01-11 DIAGNOSIS — G8929 Other chronic pain: Secondary | ICD-10-CM | POA: Diagnosis not present

## 2021-01-18 ENCOUNTER — Ambulatory Visit (INDEPENDENT_AMBULATORY_CARE_PROVIDER_SITE_OTHER): Payer: PPO | Admitting: Orthopaedic Surgery

## 2021-01-18 ENCOUNTER — Other Ambulatory Visit: Payer: Self-pay

## 2021-01-18 ENCOUNTER — Encounter: Payer: Self-pay | Admitting: Orthopaedic Surgery

## 2021-01-18 VITALS — Ht 64.0 in | Wt 150.0 lb

## 2021-01-18 DIAGNOSIS — M7542 Impingement syndrome of left shoulder: Secondary | ICD-10-CM | POA: Diagnosis not present

## 2021-01-18 NOTE — Progress Notes (Signed)
Office Visit Note   Patient: Haley Caldwell           Date of Birth: March 25, 1961           MRN: [id number] Visit Date: 01/18/2021              Requested by: Monico Blitz, MD [address] [place],  [place] [address] PCP: Monico Blitz, MD   Assessment & Plan: Visit Diagnoses:  1. Impingement syndrome of left shoulder     Plan: Patient has impingement left shoulder with rotator cuff tendinopathy without full-thickness tear.  We will set her up for some physical therapy and recheck her in 7 weeks.  Pathophysiology discussed treatment options discussed.  She understands that this is something with modifications that she should get improvement in the tendinopathy.  Principal problems have been with mopping sweeping outstretch reaching, yard work etc.  Follow-Up Instructions: Return in about 6 weeks (around 03/01/2021).   Orders:  No orders of the defined types were placed in this encounter.  No orders of the defined types were placed in this encounter.     Procedures: No procedures performed   Clinical Data: No additional findings.   Subjective: Chief Complaint  Patient presents with   Left Shoulder - Pain, Follow-up    MRI review    HPI 60 year old female returns with ongoing problems with left shoulder pain nondominant shoulder.  She is able to get her arm up overhead only if she brings it across her chest medially and then brings her elbow up in front of her face.  With straight abduction she has sharp pain and can only make it 200 degrees.  Previous injection gave her 2 weeks of pain relief and then she states it returned and was actually somewhat worse.  She does gardening and has been avoiding picking up her potted plants with her left arm and hand.  If not been recently waking her up.  Review of Systems 14 point system updated unchanged.   Objective: Vital Signs: Ht 5\' 4"  (1.626 m)   Wt 150 lb (68 kg)   BMI 25.75 kg/m   Physical Exam Constitutional:       Appearance: She is well-developed.  HENT:     Head: Normocephalic.     Right Ear: External ear normal.     Left Ear: External ear normal. There is no impacted cerumen.  Eyes:     Pupils: Pupils are equal, round, and reactive to light.  Neck:     Thyroid: No thyromegaly.     Trachea: No tracheal deviation.  Cardiovascular:     Rate and Rhythm: Normal rate.  Pulmonary:     Effort: Pulmonary effort is normal.  Abdominal:     Palpations: Abdomen is soft.  Musculoskeletal:     Cervical back: No rigidity.  Skin:    General: Skin is warm and dry.  Neurological:     Mental Status: She is alert and oriented to person, place, and time.  Psychiatric:        Behavior: Behavior normal.    Ortho Exam negative Spurling normal sensation to the hand no atrophy upper extremity.  Positive impingement left shoulder negative right.  Pain with drop arm but no weakness.  Getting her arm up overhead she has to do a crossarm position and then swing it up in front of her face to get to full flexed position.  Mild supraspinatus tenderness.  Specialty Comments:  No specialty comments available.  Imaging: CLINICAL  DATA:  Left shoulder pain for 3 months.  No known injury.   EXAM: MRI OF THE LEFT SHOULDER WITHOUT CONTRAST   TECHNIQUE: Multiplanar, multisequence MR imaging of the shoulder was performed. No intravenous contrast was administered.   COMPARISON:  None.   FINDINGS: Rotator cuff: Intact. There is thickening and heterogeneously increased T2 signal in the cuff tendons consistent with tendinopathy.   Muscles:  Normal.  No atrophy or focal lesion.   Biceps long head:  Intact.   Acromioclavicular Joint: Normal. Type 2 acromion. No subacromial/subdeltoid bursal fluid.   Glenohumeral Joint: Mild degenerative change is seen.   Labrum:  Appears intact.   Bones: No fracture, contusion or focal lesion. Reactive marrow signal change from rotator cuff tendinopathy in the greater tuberosity  noted.   Other: None.   IMPRESSION: Rotator cuff tendinopathy without tear.   Mild appearing glenohumeral degenerative disease.     Electronically Signed   By: Inge Rise M.D.   On: 01/09/2021 12:45   PMFS History: Patient Active Problem List   Diagnosis Date Noted   Impingement syndrome of left shoulder 11/30/2020   Past Medical History:  Diagnosis Date   Anxiety    social anxiety   Fibromyalgia    H/O degenerative disc disease    History of kidney stones    Hyperlipemia    Sleep apnea     Family History  Problem Relation Age of Onset   Brain cancer Paternal Grandfather     Past Surgical History:  Procedure Laterality Date   ABDOMINAL HYSTERECTOMY     BREAST REDUCTION SURGERY     STRABISMUS SURGERY Bilateral 12/06/2016   Procedure: BILATERAL STRABISMUS REPAIR;  Surgeon: Everitt Amber, MD;  Location: [facility];  Service: Ophthalmology;  Laterality: Bilateral;   TONSILLECTOMY     Social History   Occupational History   Not on file  Tobacco Use   Smoking status: Former    Pack years: 0.00   Smokeless tobacco: Never  Vaping Use   Vaping Use: Some days  Substance and Sexual Activity   Alcohol use: No   Drug use: No   Sexual activity: Not on file

## 2021-01-18 NOTE — Addendum Note (Signed)
Addended by: Meyer Cory on: 01/18/2021 10:19 AM   Modules accepted: Orders

## 2021-02-07 DIAGNOSIS — Z79899 Other long term (current) drug therapy: Secondary | ICD-10-CM | POA: Diagnosis not present

## 2021-02-07 DIAGNOSIS — E559 Vitamin D deficiency, unspecified: Secondary | ICD-10-CM | POA: Diagnosis not present

## 2021-02-07 DIAGNOSIS — Z299 Encounter for prophylactic measures, unspecified: Secondary | ICD-10-CM | POA: Diagnosis not present

## 2021-02-07 DIAGNOSIS — Z1339 Encounter for screening examination for other mental health and behavioral disorders: Secondary | ICD-10-CM | POA: Diagnosis not present

## 2021-02-07 DIAGNOSIS — R5383 Other fatigue: Secondary | ICD-10-CM | POA: Diagnosis not present

## 2021-02-07 DIAGNOSIS — E7800 Pure hypercholesterolemia, unspecified: Secondary | ICD-10-CM | POA: Diagnosis not present

## 2021-02-07 DIAGNOSIS — Z7189 Other specified counseling: Secondary | ICD-10-CM | POA: Diagnosis not present

## 2021-02-07 DIAGNOSIS — Z0000 Encounter for general adult medical examination without abnormal findings: Secondary | ICD-10-CM | POA: Diagnosis not present

## 2021-02-07 DIAGNOSIS — Z1331 Encounter for screening for depression: Secondary | ICD-10-CM | POA: Diagnosis not present

## 2021-02-07 DIAGNOSIS — Z6825 Body mass index (BMI) 25.0-25.9, adult: Secondary | ICD-10-CM | POA: Diagnosis not present

## 2021-03-01 ENCOUNTER — Ambulatory Visit: Payer: PPO | Admitting: Orthopaedic Surgery

## 2021-03-13 DIAGNOSIS — Z299 Encounter for prophylactic measures, unspecified: Secondary | ICD-10-CM | POA: Diagnosis not present

## 2021-03-13 DIAGNOSIS — Z713 Dietary counseling and surveillance: Secondary | ICD-10-CM | POA: Diagnosis not present

## 2021-03-13 DIAGNOSIS — M797 Fibromyalgia: Secondary | ICD-10-CM | POA: Diagnosis not present

## 2021-03-13 DIAGNOSIS — Z6826 Body mass index (BMI) 26.0-26.9, adult: Secondary | ICD-10-CM | POA: Diagnosis not present

## 2021-04-12 DIAGNOSIS — B001 Herpesviral vesicular dermatitis: Secondary | ICD-10-CM | POA: Diagnosis not present

## 2021-04-12 DIAGNOSIS — M797 Fibromyalgia: Secondary | ICD-10-CM | POA: Diagnosis not present

## 2021-04-12 DIAGNOSIS — G8929 Other chronic pain: Secondary | ICD-10-CM | POA: Diagnosis not present

## 2021-04-12 DIAGNOSIS — Z299 Encounter for prophylactic measures, unspecified: Secondary | ICD-10-CM | POA: Diagnosis not present

## 2021-04-12 DIAGNOSIS — Z79899 Other long term (current) drug therapy: Secondary | ICD-10-CM | POA: Diagnosis not present

## 2021-05-10 DIAGNOSIS — M7552 Bursitis of left shoulder: Secondary | ICD-10-CM | POA: Diagnosis not present

## 2021-05-10 DIAGNOSIS — G2581 Restless legs syndrome: Secondary | ICD-10-CM | POA: Diagnosis not present

## 2021-05-10 DIAGNOSIS — Z299 Encounter for prophylactic measures, unspecified: Secondary | ICD-10-CM | POA: Diagnosis not present

## 2021-05-10 DIAGNOSIS — M797 Fibromyalgia: Secondary | ICD-10-CM | POA: Diagnosis not present

## 2021-05-10 DIAGNOSIS — G8929 Other chronic pain: Secondary | ICD-10-CM | POA: Diagnosis not present

## 2021-06-07 DIAGNOSIS — J029 Acute pharyngitis, unspecified: Secondary | ICD-10-CM | POA: Diagnosis not present

## 2021-06-07 DIAGNOSIS — R1310 Dysphagia, unspecified: Secondary | ICD-10-CM | POA: Diagnosis not present

## 2021-06-07 DIAGNOSIS — M542 Cervicalgia: Secondary | ICD-10-CM | POA: Diagnosis not present

## 2021-06-07 DIAGNOSIS — Z299 Encounter for prophylactic measures, unspecified: Secondary | ICD-10-CM | POA: Diagnosis not present

## 2021-06-07 DIAGNOSIS — G8929 Other chronic pain: Secondary | ICD-10-CM | POA: Diagnosis not present

## 2021-07-10 DIAGNOSIS — G8929 Other chronic pain: Secondary | ICD-10-CM | POA: Diagnosis not present

## 2021-07-10 DIAGNOSIS — Z6826 Body mass index (BMI) 26.0-26.9, adult: Secondary | ICD-10-CM | POA: Diagnosis not present

## 2021-07-10 DIAGNOSIS — Z299 Encounter for prophylactic measures, unspecified: Secondary | ICD-10-CM | POA: Diagnosis not present

## 2021-07-10 DIAGNOSIS — Z2821 Immunization not carried out because of patient refusal: Secondary | ICD-10-CM | POA: Diagnosis not present

## 2021-07-10 DIAGNOSIS — B029 Zoster without complications: Secondary | ICD-10-CM | POA: Diagnosis not present

## 2021-08-09 DIAGNOSIS — Z713 Dietary counseling and surveillance: Secondary | ICD-10-CM | POA: Diagnosis not present

## 2021-08-09 DIAGNOSIS — Z299 Encounter for prophylactic measures, unspecified: Secondary | ICD-10-CM | POA: Diagnosis not present

## 2021-08-09 DIAGNOSIS — M797 Fibromyalgia: Secondary | ICD-10-CM | POA: Diagnosis not present

## 2021-08-09 DIAGNOSIS — Z6826 Body mass index (BMI) 26.0-26.9, adult: Secondary | ICD-10-CM | POA: Diagnosis not present

## 2021-08-09 DIAGNOSIS — Z87891 Personal history of nicotine dependence: Secondary | ICD-10-CM | POA: Diagnosis not present

## 2021-09-06 DIAGNOSIS — X32XXXA Exposure to sunlight, initial encounter: Secondary | ICD-10-CM | POA: Diagnosis not present

## 2021-09-06 DIAGNOSIS — L570 Actinic keratosis: Secondary | ICD-10-CM | POA: Diagnosis not present

## 2021-09-06 DIAGNOSIS — D225 Melanocytic nevi of trunk: Secondary | ICD-10-CM | POA: Diagnosis not present

## 2021-09-06 DIAGNOSIS — C44519 Basal cell carcinoma of skin of other part of trunk: Secondary | ICD-10-CM | POA: Diagnosis not present

## 2021-09-06 DIAGNOSIS — L820 Inflamed seborrheic keratosis: Secondary | ICD-10-CM | POA: Diagnosis not present

## 2021-09-06 DIAGNOSIS — Z85828 Personal history of other malignant neoplasm of skin: Secondary | ICD-10-CM | POA: Diagnosis not present

## 2021-09-06 DIAGNOSIS — Z08 Encounter for follow-up examination after completed treatment for malignant neoplasm: Secondary | ICD-10-CM | POA: Diagnosis not present

## 2021-09-06 DIAGNOSIS — L821 Other seborrheic keratosis: Secondary | ICD-10-CM | POA: Diagnosis not present

## 2021-09-06 DIAGNOSIS — Z1283 Encounter for screening for malignant neoplasm of skin: Secondary | ICD-10-CM | POA: Diagnosis not present

## 2021-09-07 DIAGNOSIS — Z6826 Body mass index (BMI) 26.0-26.9, adult: Secondary | ICD-10-CM | POA: Diagnosis not present

## 2021-09-07 DIAGNOSIS — Z299 Encounter for prophylactic measures, unspecified: Secondary | ICD-10-CM | POA: Diagnosis not present

## 2021-09-07 DIAGNOSIS — Z87891 Personal history of nicotine dependence: Secondary | ICD-10-CM | POA: Diagnosis not present

## 2021-09-07 DIAGNOSIS — G8929 Other chronic pain: Secondary | ICD-10-CM | POA: Diagnosis not present

## 2021-09-07 DIAGNOSIS — M797 Fibromyalgia: Secondary | ICD-10-CM | POA: Diagnosis not present

## 2021-09-24 DIAGNOSIS — H532 Diplopia: Secondary | ICD-10-CM | POA: Diagnosis not present

## 2021-10-04 DIAGNOSIS — Z87891 Personal history of nicotine dependence: Secondary | ICD-10-CM | POA: Diagnosis not present

## 2021-10-04 DIAGNOSIS — Z299 Encounter for prophylactic measures, unspecified: Secondary | ICD-10-CM | POA: Diagnosis not present

## 2021-10-04 DIAGNOSIS — E7800 Pure hypercholesterolemia, unspecified: Secondary | ICD-10-CM | POA: Diagnosis not present

## 2021-10-04 DIAGNOSIS — G8929 Other chronic pain: Secondary | ICD-10-CM | POA: Diagnosis not present

## 2021-10-04 DIAGNOSIS — Z6826 Body mass index (BMI) 26.0-26.9, adult: Secondary | ICD-10-CM | POA: Diagnosis not present

## 2021-10-04 DIAGNOSIS — M545 Low back pain, unspecified: Secondary | ICD-10-CM | POA: Diagnosis not present

## 2021-10-17 DIAGNOSIS — Z85828 Personal history of other malignant neoplasm of skin: Secondary | ICD-10-CM | POA: Diagnosis not present

## 2021-10-17 DIAGNOSIS — Z08 Encounter for follow-up examination after completed treatment for malignant neoplasm: Secondary | ICD-10-CM | POA: Diagnosis not present

## 2021-11-07 DIAGNOSIS — Z299 Encounter for prophylactic measures, unspecified: Secondary | ICD-10-CM | POA: Diagnosis not present

## 2021-11-07 DIAGNOSIS — Z6826 Body mass index (BMI) 26.0-26.9, adult: Secondary | ICD-10-CM | POA: Diagnosis not present

## 2021-11-07 DIAGNOSIS — Z87891 Personal history of nicotine dependence: Secondary | ICD-10-CM | POA: Diagnosis not present

## 2021-11-07 DIAGNOSIS — Z79899 Other long term (current) drug therapy: Secondary | ICD-10-CM | POA: Diagnosis not present

## 2021-11-07 DIAGNOSIS — E7800 Pure hypercholesterolemia, unspecified: Secondary | ICD-10-CM | POA: Diagnosis not present

## 2021-11-07 DIAGNOSIS — M797 Fibromyalgia: Secondary | ICD-10-CM | POA: Diagnosis not present

## 2021-12-05 DIAGNOSIS — Z299 Encounter for prophylactic measures, unspecified: Secondary | ICD-10-CM | POA: Diagnosis not present

## 2021-12-05 DIAGNOSIS — E7800 Pure hypercholesterolemia, unspecified: Secondary | ICD-10-CM | POA: Diagnosis not present

## 2021-12-05 DIAGNOSIS — M797 Fibromyalgia: Secondary | ICD-10-CM | POA: Diagnosis not present

## 2022-01-01 DIAGNOSIS — Z299 Encounter for prophylactic measures, unspecified: Secondary | ICD-10-CM | POA: Diagnosis not present

## 2022-01-01 DIAGNOSIS — E7800 Pure hypercholesterolemia, unspecified: Secondary | ICD-10-CM | POA: Diagnosis not present

## 2022-01-01 DIAGNOSIS — G8929 Other chronic pain: Secondary | ICD-10-CM | POA: Diagnosis not present

## 2022-01-30 DIAGNOSIS — M545 Low back pain, unspecified: Secondary | ICD-10-CM | POA: Diagnosis not present

## 2022-01-30 DIAGNOSIS — Z79899 Other long term (current) drug therapy: Secondary | ICD-10-CM | POA: Diagnosis not present

## 2022-01-30 DIAGNOSIS — Z299 Encounter for prophylactic measures, unspecified: Secondary | ICD-10-CM | POA: Diagnosis not present

## 2022-02-12 DIAGNOSIS — E7800 Pure hypercholesterolemia, unspecified: Secondary | ICD-10-CM | POA: Diagnosis not present

## 2022-02-12 DIAGNOSIS — Z1331 Encounter for screening for depression: Secondary | ICD-10-CM | POA: Diagnosis not present

## 2022-02-12 DIAGNOSIS — Z299 Encounter for prophylactic measures, unspecified: Secondary | ICD-10-CM | POA: Diagnosis not present

## 2022-02-12 DIAGNOSIS — E559 Vitamin D deficiency, unspecified: Secondary | ICD-10-CM | POA: Diagnosis not present

## 2022-02-12 DIAGNOSIS — R5383 Other fatigue: Secondary | ICD-10-CM | POA: Diagnosis not present

## 2022-02-12 DIAGNOSIS — Z1339 Encounter for screening examination for other mental health and behavioral disorders: Secondary | ICD-10-CM | POA: Diagnosis not present

## 2022-02-12 DIAGNOSIS — Z79899 Other long term (current) drug therapy: Secondary | ICD-10-CM | POA: Diagnosis not present

## 2022-02-12 DIAGNOSIS — Z7189 Other specified counseling: Secondary | ICD-10-CM | POA: Diagnosis not present

## 2022-02-12 DIAGNOSIS — Z6825 Body mass index (BMI) 25.0-25.9, adult: Secondary | ICD-10-CM | POA: Diagnosis not present

## 2022-02-12 DIAGNOSIS — Z0000 Encounter for general adult medical examination without abnormal findings: Secondary | ICD-10-CM | POA: Diagnosis not present

## 2022-03-11 DIAGNOSIS — G4733 Obstructive sleep apnea (adult) (pediatric): Secondary | ICD-10-CM | POA: Diagnosis not present

## 2022-03-14 ENCOUNTER — Other Ambulatory Visit: Payer: Self-pay | Admitting: Internal Medicine

## 2022-03-14 DIAGNOSIS — Z1231 Encounter for screening mammogram for malignant neoplasm of breast: Secondary | ICD-10-CM

## 2022-03-27 DIAGNOSIS — G4733 Obstructive sleep apnea (adult) (pediatric): Secondary | ICD-10-CM | POA: Diagnosis not present

## 2022-03-27 DIAGNOSIS — L989 Disorder of the skin and subcutaneous tissue, unspecified: Secondary | ICD-10-CM | POA: Diagnosis not present

## 2022-03-27 DIAGNOSIS — M797 Fibromyalgia: Secondary | ICD-10-CM | POA: Diagnosis not present

## 2022-03-27 DIAGNOSIS — Z6825 Body mass index (BMI) 25.0-25.9, adult: Secondary | ICD-10-CM | POA: Diagnosis not present

## 2022-03-27 DIAGNOSIS — Z299 Encounter for prophylactic measures, unspecified: Secondary | ICD-10-CM | POA: Diagnosis not present

## 2022-04-17 ENCOUNTER — Ambulatory Visit
Admission: RE | Admit: 2022-04-17 | Discharge: 2022-04-17 | Disposition: A | Discharge status: Home / Self Care | Payer: PPO | Source: Ambulatory Visit | Attending: Internal Medicine | Admitting: Internal Medicine

## 2022-04-17 DIAGNOSIS — Z1231 Encounter for screening mammogram for malignant neoplasm of breast: Secondary | ICD-10-CM | POA: Diagnosis not present

## 2022-04-24 DIAGNOSIS — E7800 Pure hypercholesterolemia, unspecified: Secondary | ICD-10-CM | POA: Diagnosis not present

## 2022-04-24 DIAGNOSIS — Z299 Encounter for prophylactic measures, unspecified: Secondary | ICD-10-CM | POA: Diagnosis not present

## 2022-04-24 DIAGNOSIS — M797 Fibromyalgia: Secondary | ICD-10-CM | POA: Diagnosis not present

## 2022-05-29 DIAGNOSIS — M797 Fibromyalgia: Secondary | ICD-10-CM | POA: Diagnosis not present

## 2022-05-29 DIAGNOSIS — Z713 Dietary counseling and surveillance: Secondary | ICD-10-CM | POA: Diagnosis not present

## 2022-05-29 DIAGNOSIS — G8929 Other chronic pain: Secondary | ICD-10-CM | POA: Diagnosis not present

## 2022-05-29 DIAGNOSIS — Z6825 Body mass index (BMI) 25.0-25.9, adult: Secondary | ICD-10-CM | POA: Diagnosis not present

## 2022-06-12 IMAGING — MR MR SHOULDER*L* W/O CM
6 series · 40 of 40 positions shown · non-contrast
Comparison: None.

CLINICAL DATA: Left shoulder pain for 3 months.  No known injury.

EXAM:
MRI OF THE LEFT SHOULDER WITHOUT CONTRAST
TECHNIQUE: Multiplanar, multisequence MR imaging of the shoulder was performed.
No intravenous contrast was administered.

[Series 6: T2 fat-sat · oblique · left · 4.0mm · 0.47mm/px · 7 of 26 slices shown (1 of 3)]
[im 1/26]
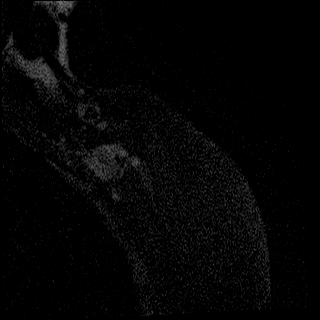
[im 5/26]
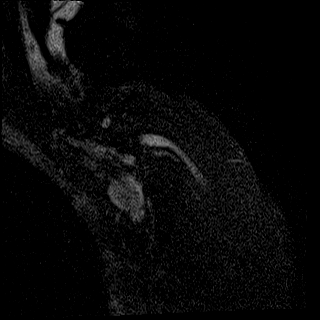
[im 9/26]
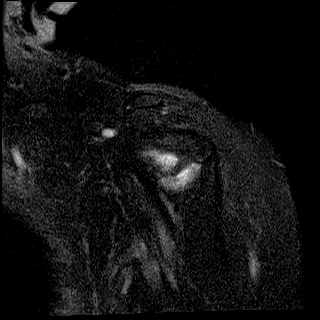
[im 13/26]
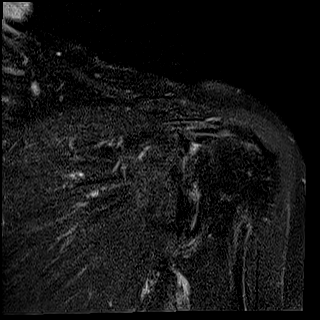
[im 17/26]
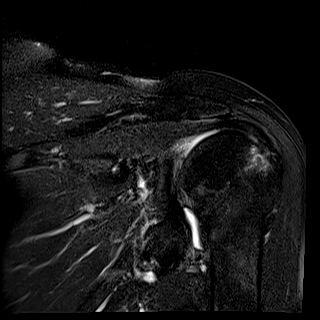
[im 21/26]
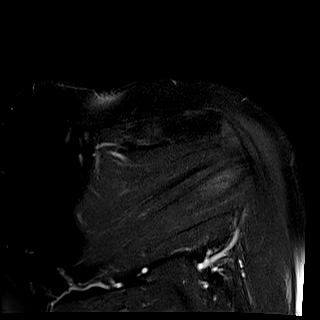
[im 26/26]
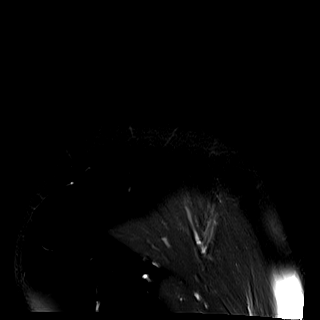

[Series 7: PD · oblique · left · 4.0mm · 0.47mm/px · 7 of 26 slices shown]
[im 1/26]
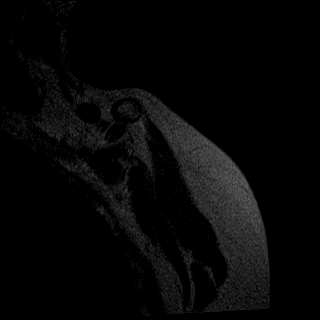
[im 5/26]
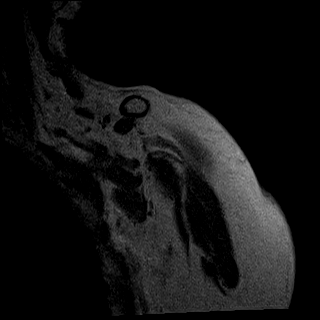
[im 9/26]
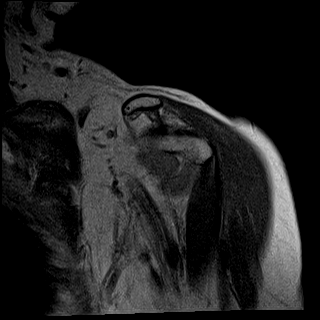
[im 13/26]
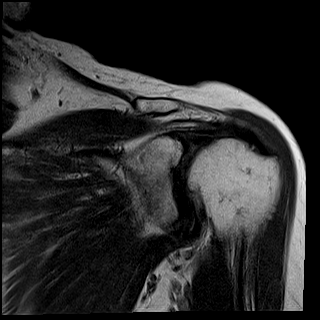
[im 17/26]
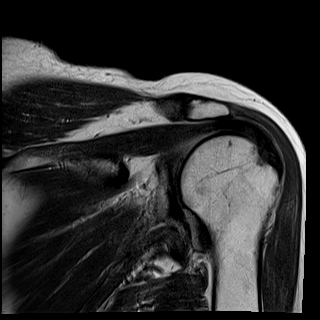
[im 21/26]
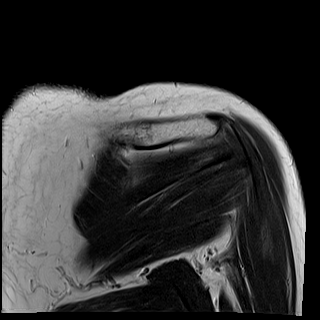
[im 26/26]
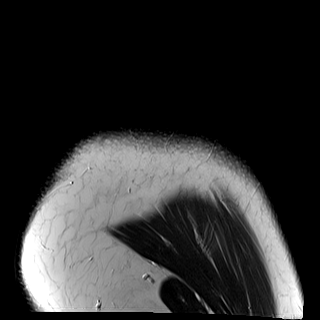

[Series 8: T2 fat-sat · sagittal · left · 4.0mm · 0.44mm/px · 6 of 24 slices shown (2 of 3)]
[im 1/24]
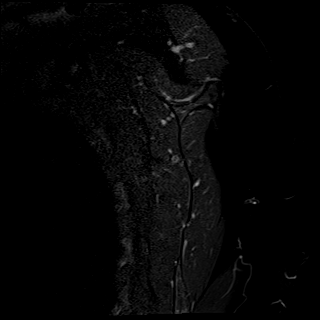
[im 5/24]
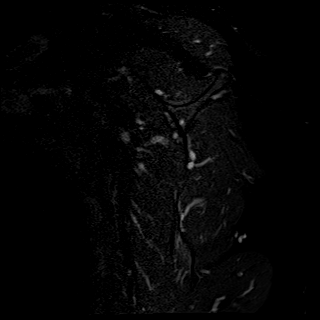
[im 10/24]
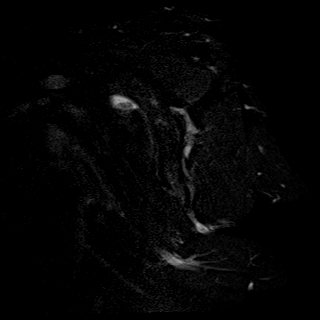
[im 14/24]
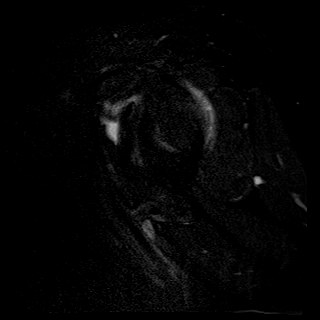
[im 19/24]
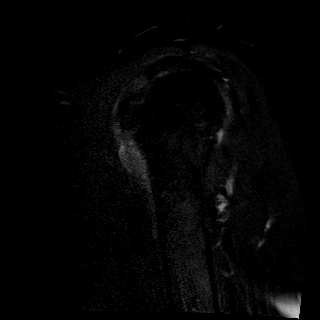
[im 24/24]
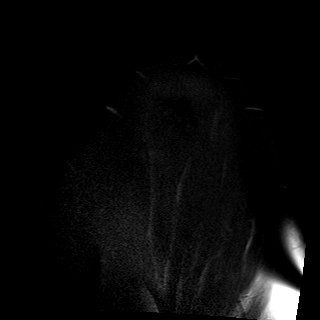

[Series 9: T1 · sagittal · left · 4.0mm · 0.41mm/px · 7 of 25 slices shown]
[im 1/25]
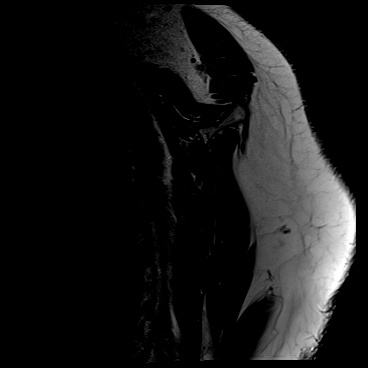
[im 5/25]
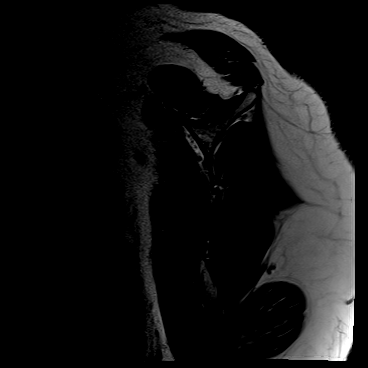
[im 9/25]
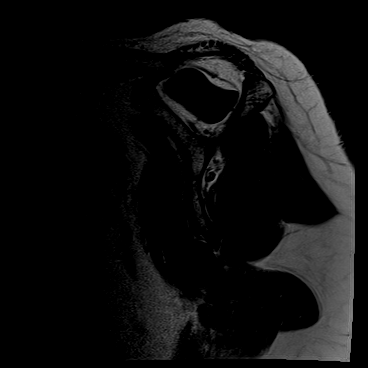
[im 13/25]
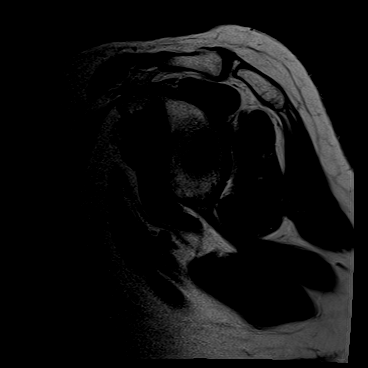
[im 17/25]
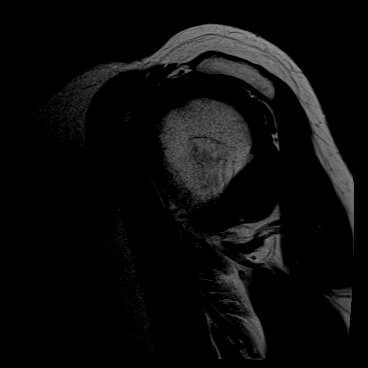
[im 21/25]
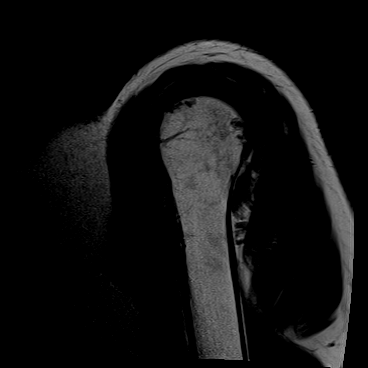
[im 25/25]
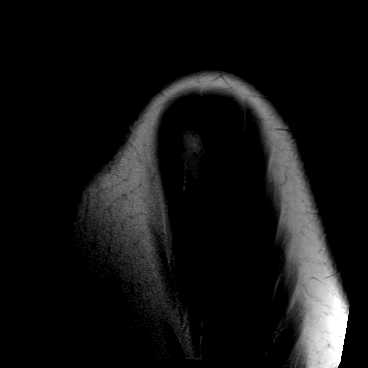

[Series 10: T2 fat-sat · axial · left · 4.0mm · 0.47mm/px · z∈[-173,-65]mm · 7 of 26 slices shown (3 of 3)]
[im 1/26]
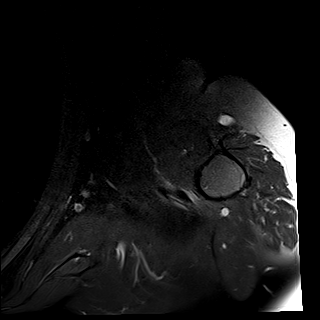
[im 5/26]
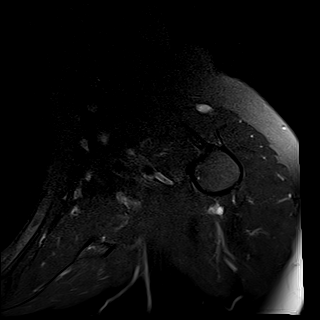
[im 9/26]
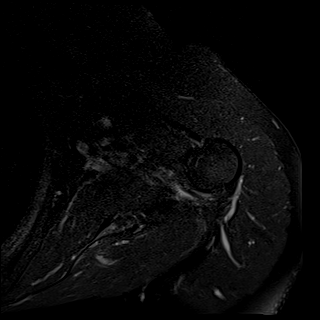
[im 13/26]
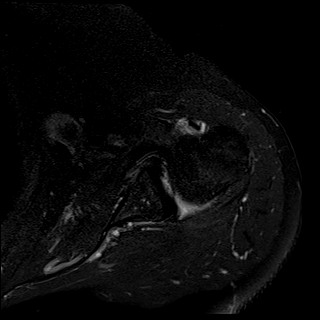
[im 17/26]
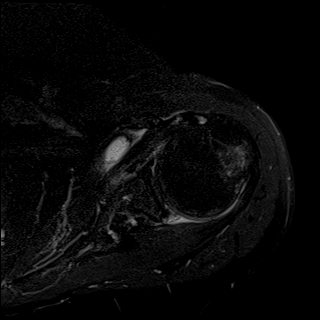
[im 21/26]
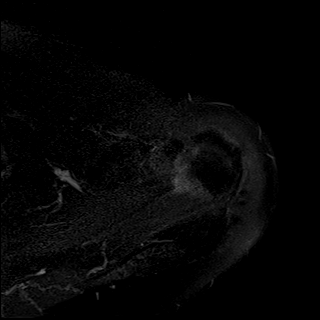
[im 26/26]
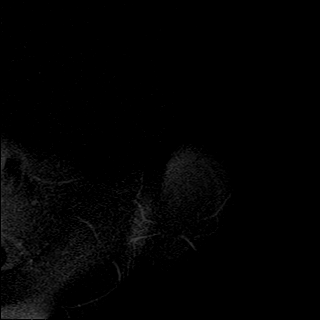

[Series 11: t2_blade_fs_cor · oblique · left · 4.0mm · 0.46mm/px · 6 of 21 slices shown]
[im 1/21]
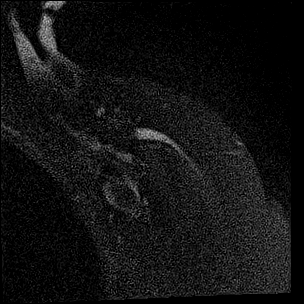
[im 5/21]
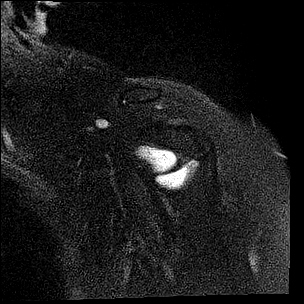
[im 9/21]
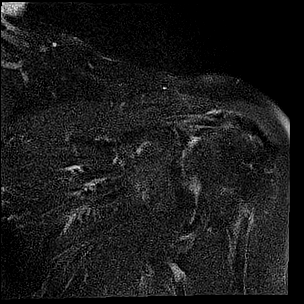
[im 13/21]
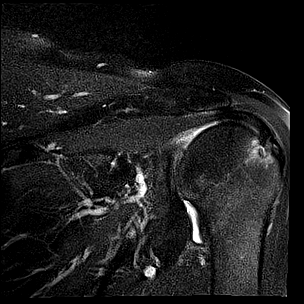
[im 17/21]
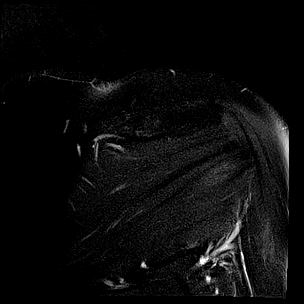
[im 21/21]
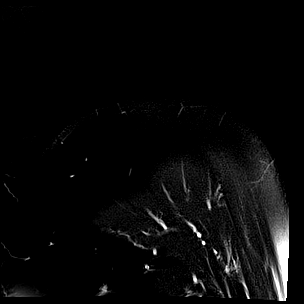

[40 of 40 positions shown; findings below may reference images not displayed]

FINDINGS: Rotator cuff: Intact. There is thickening and heterogeneously
increased T2 signal in the cuff tendons consistent with
tendinopathy.

Muscles:  Normal.  No atrophy or focal lesion.

Biceps long head:  Intact.

Acromioclavicular Joint: Normal. Type 2 acromion. No
subacromial/subdeltoid bursal fluid.

Glenohumeral Joint: Mild degenerative change is seen.

Labrum:  Appears intact.

Bones: No fracture, contusion or focal lesion. Reactive marrow
signal change from rotator cuff tendinopathy in the greater
tuberosity noted.

Other: None.
IMPRESSION: Rotator cuff tendinopathy without tear.

Mild appearing glenohumeral degenerative disease.

## 2022-06-19 DIAGNOSIS — H5000 Unspecified esotropia: Secondary | ICD-10-CM | POA: Diagnosis not present

## 2022-06-19 DIAGNOSIS — Z9889 Other specified postprocedural states: Secondary | ICD-10-CM | POA: Diagnosis not present

## 2022-06-19 DIAGNOSIS — H518 Other specified disorders of binocular movement: Secondary | ICD-10-CM | POA: Diagnosis not present

## 2022-06-26 DIAGNOSIS — E7800 Pure hypercholesterolemia, unspecified: Secondary | ICD-10-CM | POA: Diagnosis not present

## 2022-06-26 DIAGNOSIS — Z713 Dietary counseling and surveillance: Secondary | ICD-10-CM | POA: Diagnosis not present

## 2022-06-26 DIAGNOSIS — Z6825 Body mass index (BMI) 25.0-25.9, adult: Secondary | ICD-10-CM | POA: Diagnosis not present

## 2022-06-26 DIAGNOSIS — G8929 Other chronic pain: Secondary | ICD-10-CM | POA: Diagnosis not present

## 2022-06-26 DIAGNOSIS — Z299 Encounter for prophylactic measures, unspecified: Secondary | ICD-10-CM | POA: Diagnosis not present

## 2022-07-22 DIAGNOSIS — M797 Fibromyalgia: Secondary | ICD-10-CM | POA: Diagnosis not present

## 2022-07-22 DIAGNOSIS — R21 Rash and other nonspecific skin eruption: Secondary | ICD-10-CM | POA: Diagnosis not present

## 2022-08-21 DIAGNOSIS — H518 Other specified disorders of binocular movement: Secondary | ICD-10-CM | POA: Insufficient documentation

## 2022-08-21 DIAGNOSIS — H5000 Unspecified esotropia: Secondary | ICD-10-CM | POA: Insufficient documentation

## 2022-08-21 DIAGNOSIS — H5022 Vertical strabismus, left eye: Secondary | ICD-10-CM | POA: Insufficient documentation

## 2022-08-21 DIAGNOSIS — Z9889 Other specified postprocedural states: Secondary | ICD-10-CM | POA: Diagnosis not present

## 2022-08-21 DIAGNOSIS — H532 Diplopia: Secondary | ICD-10-CM | POA: Insufficient documentation

## 2022-08-28 DIAGNOSIS — M797 Fibromyalgia: Secondary | ICD-10-CM | POA: Diagnosis not present

## 2022-08-28 DIAGNOSIS — R42 Dizziness and giddiness: Secondary | ICD-10-CM | POA: Diagnosis not present

## 2022-09-25 DIAGNOSIS — U071 COVID-19: Secondary | ICD-10-CM | POA: Diagnosis not present

## 2022-09-25 DIAGNOSIS — G8929 Other chronic pain: Secondary | ICD-10-CM | POA: Diagnosis not present

## 2022-09-25 DIAGNOSIS — Z87891 Personal history of nicotine dependence: Secondary | ICD-10-CM | POA: Diagnosis not present

## 2022-10-23 DIAGNOSIS — G8929 Other chronic pain: Secondary | ICD-10-CM | POA: Diagnosis not present

## 2022-10-23 DIAGNOSIS — M545 Low back pain, unspecified: Secondary | ICD-10-CM | POA: Diagnosis not present

## 2022-11-27 DIAGNOSIS — G8929 Other chronic pain: Secondary | ICD-10-CM | POA: Diagnosis not present

## 2022-11-27 DIAGNOSIS — E7800 Pure hypercholesterolemia, unspecified: Secondary | ICD-10-CM | POA: Diagnosis not present

## 2022-12-25 DIAGNOSIS — Z0000 Encounter for general adult medical examination without abnormal findings: Secondary | ICD-10-CM | POA: Diagnosis not present

## 2022-12-25 DIAGNOSIS — K219 Gastro-esophageal reflux disease without esophagitis: Secondary | ICD-10-CM | POA: Diagnosis not present

## 2022-12-25 DIAGNOSIS — Z299 Encounter for prophylactic measures, unspecified: Secondary | ICD-10-CM | POA: Diagnosis not present

## 2022-12-25 DIAGNOSIS — G8929 Other chronic pain: Secondary | ICD-10-CM | POA: Diagnosis not present

## 2022-12-25 DIAGNOSIS — E8940 Asymptomatic postprocedural ovarian failure: Secondary | ICD-10-CM | POA: Diagnosis not present

## 2022-12-25 DIAGNOSIS — Z1339 Encounter for screening examination for other mental health and behavioral disorders: Secondary | ICD-10-CM | POA: Diagnosis not present

## 2022-12-25 DIAGNOSIS — Z1331 Encounter for screening for depression: Secondary | ICD-10-CM | POA: Diagnosis not present

## 2022-12-25 DIAGNOSIS — Z7189 Other specified counseling: Secondary | ICD-10-CM | POA: Diagnosis not present

## 2023-01-22 DIAGNOSIS — M797 Fibromyalgia: Secondary | ICD-10-CM | POA: Diagnosis not present

## 2023-01-22 DIAGNOSIS — Z6823 Body mass index (BMI) 23.0-23.9, adult: Secondary | ICD-10-CM | POA: Diagnosis not present

## 2023-02-01 DIAGNOSIS — N390 Urinary tract infection, site not specified: Secondary | ICD-10-CM | POA: Diagnosis not present

## 2023-02-01 DIAGNOSIS — R350 Frequency of micturition: Secondary | ICD-10-CM | POA: Diagnosis not present

## 2023-02-03 DIAGNOSIS — R350 Frequency of micturition: Secondary | ICD-10-CM | POA: Diagnosis not present

## 2023-02-03 DIAGNOSIS — N390 Urinary tract infection, site not specified: Secondary | ICD-10-CM | POA: Diagnosis not present

## 2023-02-19 DIAGNOSIS — E7800 Pure hypercholesterolemia, unspecified: Secondary | ICD-10-CM | POA: Diagnosis not present

## 2023-02-19 DIAGNOSIS — Z79899 Other long term (current) drug therapy: Secondary | ICD-10-CM | POA: Diagnosis not present

## 2023-02-19 DIAGNOSIS — Z299 Encounter for prophylactic measures, unspecified: Secondary | ICD-10-CM | POA: Diagnosis not present

## 2023-02-19 DIAGNOSIS — E559 Vitamin D deficiency, unspecified: Secondary | ICD-10-CM | POA: Diagnosis not present

## 2023-02-19 DIAGNOSIS — Z0000 Encounter for general adult medical examination without abnormal findings: Secondary | ICD-10-CM | POA: Diagnosis not present

## 2023-02-19 DIAGNOSIS — R5383 Other fatigue: Secondary | ICD-10-CM | POA: Diagnosis not present

## 2023-02-19 DIAGNOSIS — R350 Frequency of micturition: Secondary | ICD-10-CM | POA: Diagnosis not present

## 2023-03-07 DIAGNOSIS — H524 Presbyopia: Secondary | ICD-10-CM | POA: Diagnosis not present

## 2023-03-20 ENCOUNTER — Other Ambulatory Visit: Payer: Self-pay | Admitting: Internal Medicine

## 2023-03-20 DIAGNOSIS — Z1231 Encounter for screening mammogram for malignant neoplasm of breast: Secondary | ICD-10-CM

## 2023-03-28 DIAGNOSIS — Z6823 Body mass index (BMI) 23.0-23.9, adult: Secondary | ICD-10-CM | POA: Diagnosis not present

## 2023-03-28 DIAGNOSIS — G8929 Other chronic pain: Secondary | ICD-10-CM | POA: Diagnosis not present

## 2023-04-21 ENCOUNTER — Ambulatory Visit
Admission: RE | Admit: 2023-04-21 | Discharge: 2023-04-21 | Disposition: A | Discharge status: Home / Self Care | Payer: PPO | Source: Ambulatory Visit

## 2023-04-21 DIAGNOSIS — Z1231 Encounter for screening mammogram for malignant neoplasm of breast: Secondary | ICD-10-CM

## 2023-04-24 DIAGNOSIS — Z6823 Body mass index (BMI) 23.0-23.9, adult: Secondary | ICD-10-CM | POA: Diagnosis not present

## 2023-04-24 DIAGNOSIS — G8929 Other chronic pain: Secondary | ICD-10-CM | POA: Diagnosis not present

## 2023-05-26 DIAGNOSIS — M25561 Pain in right knee: Secondary | ICD-10-CM | POA: Diagnosis not present

## 2023-05-26 DIAGNOSIS — M797 Fibromyalgia: Secondary | ICD-10-CM | POA: Diagnosis not present

## 2023-06-03 ENCOUNTER — Encounter (INDEPENDENT_AMBULATORY_CARE_PROVIDER_SITE_OTHER): Payer: Self-pay | Admitting: *Deleted

## 2023-06-09 ENCOUNTER — Encounter (INDEPENDENT_AMBULATORY_CARE_PROVIDER_SITE_OTHER): Payer: Self-pay | Admitting: Gastroenterology

## 2023-06-09 ENCOUNTER — Ambulatory Visit (INDEPENDENT_AMBULATORY_CARE_PROVIDER_SITE_OTHER): Payer: PPO | Admitting: Gastroenterology

## 2023-06-09 VITALS — BP 134/83 | HR 72 | Temp 97.1°F | Ht 64.0 in | Wt 140.9 lb

## 2023-06-09 DIAGNOSIS — K625 Hemorrhage of anus and rectum: Secondary | ICD-10-CM | POA: Insufficient documentation

## 2023-06-09 DIAGNOSIS — K5900 Constipation, unspecified: Secondary | ICD-10-CM | POA: Insufficient documentation

## 2023-06-09 DIAGNOSIS — R194 Change in bowel habit: Secondary | ICD-10-CM

## 2023-06-09 NOTE — Patient Instructions (Addendum)
Schedule colonoscopy Perform blood workup Start taking Miralax 1 capful every day for one week. If bowel movements do not improve, increase to 2 capfuls every day. If after two weeks there is no improvement, increase to 2 capfuls in AM and one at night. If no improvement in bowel movement frequency with MiraLAX, we will need to start prescription laxatives

## 2023-06-09 NOTE — Progress Notes (Signed)
Haley Caldwell, M.D. Gastroenterology & Hepatology [facility] Gastroenterology [address] [place], [place] [address] Primary Care Physician: Kirstie Peri, MD [address] [place] [address]  Referring MD: PCP  Chief Complaint: Right toe bleeding and change in bowel movement consistency  History of Present Illness: Haley Caldwell is a 62 y.o. female with past medical history of anxiety, for myalgia, hyperlipidemia, sleep apnea, who presents for evaluation of rectal bleeding and changes in bowel movement consistency.  Patient reports that for the last couple of months, she had a change in her bowel consistency, from a Bristol 4 to 3. States she started fiber packets 4 months ago as she wanted to have more fiber in her dietary intake, but she was not having issues with constipation. After she noticed the changes in her bowel movements, she noticed 2 episodes in which she had blood mixed with her stool, which was unusual for her.  States for the last week, she noticed her stools have changed again in consistency from [facility] 3 to 1. Due to this, she had to do manual maneuvers to remove the stool. She noticed fresh blood when she did this, without dyschezia - this happened possibly 10 days ago. She did not have clots but she saw a large amount of blood. States her stools have remained Bristol 1. She states that she is moving her bowels every other day.  She states that she takes a generic of Exlax if she has not moved her bowels for 2-3 days. Recently started this, did not use to use them recently.  She drinks water multiple times a day.  The patient denies having any nausea, vomiting, fever, chills, melena, hematemesis, abdominal distention, abdominal pain, diarrhea, jaundice, pruritus or weight loss.  Patient takes hydrocodone - 1/2 tablet of 7.5 mng every 6 hours. She has taken this medication for 25 years.  Has not recently increased the dose that she  takes.  Last BJY:NWGN years ago, atient reports it was performed in [place], reports it was normal, no official report available. Last Colonoscopy:2018 - patient reports it was performed in [place], reports it was normal, no official report available.  FHx: neg for any gastrointestinal/liver disease, no malignancies Social: neg smoking, alcohol or illicit drug use Surgical: hysterectomy, oophorectomy  Past Medical History: Past Medical History:  Diagnosis Date   Anxiety    social anxiety   Fibromyalgia    H/O degenerative disc disease    History of kidney stones    Hyperlipemia    Sleep apnea     Past Surgical History: Past Surgical History:  Procedure Laterality Date   ABDOMINAL HYSTERECTOMY     BREAST REDUCTION SURGERY     STRABISMUS SURGERY Bilateral 12/06/2016   Procedure: BILATERAL STRABISMUS REPAIR;  Surgeon: Verne Carrow, MD;  Location: [facility];  Service: Ophthalmology;  Laterality: Bilateral;   TONSILLECTOMY      Family History: Family History  Problem Relation Age of Onset   Brain cancer Paternal Grandfather     Social History: Social History   Tobacco Use  Smoking Status Former  Smokeless Tobacco Never   Social History   Substance and Sexual Activity  Alcohol Use No   Social History   Substance and Sexual Activity  Drug Use No    Allergies: No Known Allergies  Medications: Current Outpatient Medications  Medication Sig Dispense Refill   acyclovir (ZOVIRAX) 200 MG capsule Take 200 mg by mouth daily at 6 (six) AM.  HYDROcodone-acetaminophen (NORCO) 7.5-325 MG tablet Take 1 tablet by mouth every 6 (six) hours as needed for moderate pain (1/2 pill 4 times per day).     meloxicam (MOBIC) 15 MG tablet Take 15 mg by mouth as needed.     pravastatin (PRAVACHOL) 40 MG tablet Take 40 mg by mouth daily.     No current facility-administered medications for this visit.    Review of Systems: GENERAL: negative for malaise, night  sweats HEENT: No changes in hearing or vision, no nose bleeds or other nasal problems. NECK: Negative for lumps, goiter, pain and significant neck swelling RESPIRATORY: Negative for cough, wheezing CARDIOVASCULAR: Negative for chest pain, leg swelling, palpitations, orthopnea GI: SEE HPI MUSCULOSKELETAL: Negative for joint pain or swelling, back pain, and muscle pain. SKIN: Negative for lesions, rash PSYCH: Negative for sleep disturbance, mood disorder and recent psychosocial stressors. HEMATOLOGY Negative for prolonged bleeding, bruising easily, and swollen nodes. ENDOCRINE: Negative for cold or heat intolerance, polyuria, polydipsia and goiter. NEURO: negative for tremor, gait imbalance, syncope and seizures. The remainder of the review of systems is noncontributory.   Physical Exam: BP 134/83 (BP Location: Left Arm, Patient Position: Sitting, Cuff Size: Normal)   Pulse 72   Temp (!) 97.1 F (36.2 C) (Temporal)   Ht 5\' 4"  (1.626 m)   Wt 140 lb 14.4 oz (63.9 kg)   BMI 24.19 kg/m  GENERAL: The patient is AO x3, in no acute distress. HEENT: Head is normocephalic and atraumatic. EOMI are intact. Mouth is well hydrated and without lesions. NECK: Supple. No masses LUNGS: Clear to auscultation. No presence of rhonchi/wheezing/rales. Adequate chest expansion HEART: RRR, normal s1 and s2. ABDOMEN: Soft, nontender, no guarding, no peritoneal signs, and nondistended. BS +. No masses. EXTREMITIES: Without any cyanosis, clubbing, rash, lesions or edema. NEUROLOGIC: AOx3, no focal motor deficit. SKIN: no jaundice, no rashes   Imaging/Labs: as above  I personally reviewed and interpreted the available labs, imaging and endoscopic files.  Impression and Plan: Haley Caldwell is a 62 y.o. female with past medical history of anxiety, for myalgia, hyperlipidemia, sleep apnea, who presents for evaluation of rectal bleeding and changes in bowel movement consistency.  Patient has presented  new onset episodes of rectal bleeding, which started after her bowel movement consistency change.  She has had smaller size stools concerning for constipation.  He has been chronically taking opiates but has not taken higher dose of the medication recently.  We will check blood workup today as part of the evaluation of constipation, but also to determine if the rectal bleeding cause some significant hemoglobin drop.  I advised the patient to start taking MiraLAX on a regular basis to improve her bowel movement frequency.  Nevertheless, given her history of rectal bleeding, we will evaluate this further with a colonoscopy.  -Schedule colonoscopy -Check CBC, CMP and TSH -Start taking Miralax 1 capful every day for one week. If bowel movements do not improve, increase to 2 capfuls every day. If after two weeks there is no improvement, increase to 2 capfuls in AM and one at night. -If no improvement in bowel movement frequency with MiraLAX, we will need to start prescription laxatives  All questions were answered.      Haley Blazing, MD Gastroenterology and Hepatology [facility] Gastroenterology

## 2023-06-10 ENCOUNTER — Encounter (INDEPENDENT_AMBULATORY_CARE_PROVIDER_SITE_OTHER): Payer: Self-pay

## 2023-06-10 LAB — COMPREHENSIVE METABOLIC PANEL
AG Ratio: 1.9 (calc) (ref 1.0–2.5)
ALT: 19 U/L (ref 6–29)
AST: 15 U/L (ref 10–35)
Albumin: 4.4 g/dL (ref 3.6–5.1)
Alkaline phosphatase (APISO): 64 U/L (ref 37–153)
BUN: 18 mg/dL (ref 7–25)
CO2: 29 mmol/L (ref 20–32)
Calcium: 9.7 mg/dL (ref 8.6–10.4)
Chloride: 103 mmol/L (ref 98–110)
Creat: 0.6 mg/dL (ref 0.50–1.05)
Globulin: 2.3 g/dL (ref 1.9–3.7)
Glucose, Bld: 105 mg/dL — ABNORMAL HIGH (ref 65–99)
Potassium: 4.7 mmol/L (ref 3.5–5.3)
Sodium: 140 mmol/L (ref 135–146)
Total Bilirubin: 0.3 mg/dL (ref 0.2–1.2)
Total Protein: 6.7 g/dL (ref 6.1–8.1)

## 2023-06-10 LAB — CBC WITH DIFFERENTIAL/PLATELET
Absolute Lymphocytes: 2132 {cells}/uL (ref 850–3900)
Absolute Monocytes: 380 {cells}/uL (ref 200–950)
Basophils Absolute: 28 {cells}/uL (ref 0–200)
Basophils Relative: 0.4 %
Eosinophils Absolute: 41 {cells}/uL (ref 15–500)
Eosinophils Relative: 0.6 %
HCT: 39 % (ref 35.0–45.0)
Hemoglobin: 12.9 g/dL (ref 11.7–15.5)
MCH: 31.3 pg (ref 27.0–33.0)
MCHC: 33.1 g/dL (ref 32.0–36.0)
MCV: 94.7 fL (ref 80.0–100.0)
MPV: 9.9 fL (ref 7.5–12.5)
Monocytes Relative: 5.5 %
Neutro Abs: 4319 {cells}/uL (ref 1500–7800)
Neutrophils Relative %: 62.6 %
Platelets: 262 10*3/uL (ref 140–400)
RBC: 4.12 10*6/uL (ref 3.80–5.10)
RDW: 12.1 % (ref 11.0–15.0)
Total Lymphocyte: 30.9 %
WBC: 6.9 10*3/uL (ref 3.8–10.8)

## 2023-06-10 LAB — TSH: TSH: 1.3 m[IU]/L (ref 0.40–4.50)

## 2023-06-10 MED ORDER — PEG 3350-KCL-NA BICARB-NACL 420 G PO SOLR: 4000.0000 mL | Freq: Once | ORAL | 0 refills | Status: AC

## 2023-06-10 NOTE — Addendum Note (Signed)
Addended by: Marlowe Shores on: 06/10/2023 12:08 PM   Modules accepted: Orders

## 2023-06-16 DIAGNOSIS — R52 Pain, unspecified: Secondary | ICD-10-CM | POA: Diagnosis not present

## 2023-06-16 DIAGNOSIS — E7800 Pure hypercholesterolemia, unspecified: Secondary | ICD-10-CM | POA: Diagnosis not present

## 2023-06-16 DIAGNOSIS — M797 Fibromyalgia: Secondary | ICD-10-CM | POA: Diagnosis not present

## 2023-07-11 ENCOUNTER — Other Ambulatory Visit: Payer: Self-pay

## 2023-07-11 ENCOUNTER — Encounter (HOSPITAL_COMMUNITY): Payer: Self-pay | Admitting: Gastroenterology

## 2023-07-11 ENCOUNTER — Ambulatory Visit (HOSPITAL_BASED_OUTPATIENT_CLINIC_OR_DEPARTMENT_OTHER): Payer: PPO | Admitting: Anesthesiology

## 2023-07-11 ENCOUNTER — Ambulatory Visit (HOSPITAL_COMMUNITY)
Admission: RE | Admit: 2023-07-11 | Discharge: 2023-07-11 | Disposition: A | Discharge status: Home / Self Care | Payer: PPO | Source: Ambulatory Visit | Attending: Gastroenterology | Admitting: Gastroenterology

## 2023-07-11 ENCOUNTER — Encounter (HOSPITAL_COMMUNITY)
Admission: RE | Disposition: A | Discharge status: Home / Self Care | Payer: Self-pay | Source: Ambulatory Visit | Attending: Gastroenterology

## 2023-07-11 ENCOUNTER — Ambulatory Visit (HOSPITAL_COMMUNITY): Payer: PPO | Admitting: Anesthesiology

## 2023-07-11 DIAGNOSIS — D123 Benign neoplasm of transverse colon: Secondary | ICD-10-CM | POA: Insufficient documentation

## 2023-07-11 DIAGNOSIS — D124 Benign neoplasm of descending colon: Secondary | ICD-10-CM

## 2023-07-11 DIAGNOSIS — K625 Hemorrhage of anus and rectum: Secondary | ICD-10-CM | POA: Insufficient documentation

## 2023-07-11 DIAGNOSIS — K635 Polyp of colon: Secondary | ICD-10-CM | POA: Diagnosis not present

## 2023-07-11 DIAGNOSIS — K5900 Constipation, unspecified: Secondary | ICD-10-CM | POA: Diagnosis not present

## 2023-07-11 DIAGNOSIS — Z87891 Personal history of nicotine dependence: Secondary | ICD-10-CM | POA: Diagnosis not present

## 2023-07-11 DIAGNOSIS — K648 Other hemorrhoids: Secondary | ICD-10-CM

## 2023-07-11 DIAGNOSIS — D128 Benign neoplasm of rectum: Secondary | ICD-10-CM

## 2023-07-11 DIAGNOSIS — G4730 Sleep apnea, unspecified: Secondary | ICD-10-CM | POA: Insufficient documentation

## 2023-07-11 HISTORY — PX: COLONOSCOPY WITH PROPOFOL: SHX5780

## 2023-07-11 HISTORY — PX: POLYPECTOMY: SHX5525

## 2023-07-11 LAB — HM COLONOSCOPY

## 2023-07-11 SURGERY — COLONOSCOPY WITH PROPOFOL
Anesthesia: General

## 2023-07-11 MED ORDER — PHENYLEPHRINE 80 MCG/ML (10ML) SYRINGE FOR IV PUSH (FOR BLOOD PRESSURE SUPPORT)
PREFILLED_SYRINGE | INTRAVENOUS | Status: AC
  Filled 2023-07-11: qty 10

## 2023-07-11 MED ORDER — LACTATED RINGERS IV SOLN: INTRAVENOUS | Status: DC

## 2023-07-11 MED ORDER — PROPOFOL 500 MG/50ML IV EMUL
INTRAVENOUS | Status: DC | PRN
  Administered 2023-07-11: 150 ug/kg/min via INTRAVENOUS

## 2023-07-11 MED ORDER — STERILE WATER FOR IRRIGATION IR SOLN
Status: DC | PRN
  Administered 2023-07-11: 100 mL

## 2023-07-11 MED ORDER — PHENYLEPHRINE 80 MCG/ML (10ML) SYRINGE FOR IV PUSH (FOR BLOOD PRESSURE SUPPORT)
PREFILLED_SYRINGE | INTRAVENOUS | Status: DC | PRN
  Administered 2023-07-11: 80 ug via INTRAVENOUS
  Administered 2023-07-11: 160 ug via INTRAVENOUS
  Administered 2023-07-11 (×3): 80 ug via INTRAVENOUS
  Administered 2023-07-11: 160 ug via INTRAVENOUS

## 2023-07-11 MED ORDER — LIDOCAINE HCL (PF) 2 % IJ SOLN
INTRAMUSCULAR | Status: DC | PRN
  Administered 2023-07-11: 50 mg via INTRADERMAL

## 2023-07-11 MED ORDER — PROPOFOL 10 MG/ML IV BOLUS
INTRAVENOUS | Status: DC | PRN
  Administered 2023-07-11: 50 mg via INTRAVENOUS
  Administered 2023-07-11: 100 mg via INTRAVENOUS
  Administered 2023-07-11: 50 mg via INTRAVENOUS

## 2023-07-11 NOTE — Anesthesia Postprocedure Evaluation (Signed)
Anesthesia Post Note  Patient: Haley Caldwell  Procedure(s) Performed: COLONOSCOPY WITH PROPOFOL POLYPECTOMY  Patient location during evaluation: Phase II Anesthesia Type: General Level of consciousness: awake Pain management: pain level controlled Vital Signs Assessment: post-procedure vital signs reviewed and stable Respiratory status: spontaneous breathing and respiratory function stable Cardiovascular status: blood pressure returned to baseline and stable Postop Assessment: no headache and no apparent nausea or vomiting Anesthetic complications: no Comments: Late entry   No notable events documented.   Last Vitals:  Vitals:   07/11/23 0756 07/11/23 0912  BP: 115/71 109/64  Pulse: 61 66  Resp: 12 13  Temp: 36.4 C 36.4 C  SpO2: 97% 98%    Last Pain:  Vitals:   07/11/23 0912  TempSrc: Oral  PainSc: 0-No pain                 Windell Norfolk

## 2023-07-11 NOTE — Anesthesia Preprocedure Evaluation (Signed)
Anesthesia Evaluation  Patient identified by MRN, date of birth, ID band Patient awake    Reviewed: Allergy & Precautions, H&P , NPO status , Patient's Chart, lab work & pertinent test results, reviewed documented beta blocker date and time   Airway Mallampati: II  TM Distance: >3 FB Neck ROM: full    Dental no notable dental hx.    Pulmonary sleep apnea , former smoker   Pulmonary exam normal breath sounds clear to auscultation       Cardiovascular Exercise Tolerance: Good hypertension, negative cardio ROS  Rhythm:regular Rate:Normal     Neuro/Psych   Anxiety      Neuromuscular disease  negative psych ROS   GI/Hepatic negative GI ROS, Neg liver ROS,,,  Endo/Other  negative endocrine ROS    Renal/GU negative Renal ROS  negative genitourinary   Musculoskeletal   Abdominal   Peds  Hematology negative hematology ROS (+)   Anesthesia Other Findings   Reproductive/Obstetrics negative OB ROS                             Anesthesia Physical Anesthesia Plan  ASA: 3  Anesthesia Plan: General   Post-op Pain Management:    Induction:   PONV Risk Score and Plan: Propofol infusion  Airway Management Planned:   Additional Equipment:   Intra-op Plan:   Post-operative Plan:   Informed Consent: I have reviewed the patients History and Physical, chart, labs and discussed the procedure including the risks, benefits and alternatives for the proposed anesthesia with the patient or authorized representative who has indicated his/her understanding and acceptance.     Dental Advisory Given  Plan Discussed with: CRNA  Anesthesia Plan Comments:        Anesthesia Quick Evaluation

## 2023-07-11 NOTE — H&P (Signed)
Haley Caldwell is an 62 y.o. female.   Chief Complaint: Rectal bleeding and constipation HPI: Haley Caldwell is a 62 y.o. female with past medical history of anxiety, for myalgia, hyperlipidemia, sleep apnea, who presents for evaluation of rectal bleeding and constipation.  Patient reports after starting MiraLAX her symptoms have completely resolved. The patient denies having any nausea, vomiting, fever, chills, hematochezia, melena, hematemesis, abdominal distention, abdominal pain, diarrhea, jaundice, pruritus or weight loss.  Past Medical History:  Diagnosis Date   Anxiety    social anxiety   Fibromyalgia    H/O degenerative disc disease    History of kidney stones    Hyperlipemia    Sleep apnea     Past Surgical History:  Procedure Laterality Date   ABDOMINAL HYSTERECTOMY     BREAST REDUCTION SURGERY     KNEE SURGERY Right    SKIN GRAFT Right    hand   STRABISMUS SURGERY Bilateral 12/06/2016   Procedure: BILATERAL STRABISMUS REPAIR;  Surgeon: Verne Carrow, MD;  Location: [facility];  Service: Ophthalmology;  Laterality: Bilateral;   TONSILLECTOMY      Family History  Problem Relation Age of Onset   Brain cancer Paternal Grandfather    Social History:  reports that she has quit smoking. She has never used smokeless tobacco. She reports that she does not drink alcohol and does not use drugs.  Allergies: No Known Allergies  Medications Prior to Admission  Medication Sig Dispense Refill   acyclovir (ZOVIRAX) 200 MG capsule Take 200 mg by mouth daily at 6 (six) AM.     cholecalciferol (VITAMIN D3) 25 MCG (1000 UNIT) tablet Take 1,000 Units by mouth daily.     esomeprazole (NEXIUM) 20 MG capsule Take 20 mg by mouth daily at 12 noon. Take 2 capsules daily     HYDROcodone-acetaminophen (NORCO) 7.5-325 MG tablet Take 1 tablet by mouth every 6 (six) hours as needed for moderate pain (1/2 pill 4 times per day).     meloxicam (MOBIC) 15 MG tablet Take 15  mg by mouth as needed.     Turmeric (QC TUMERIC COMPLEX PO) Take by mouth.     pravastatin (PRAVACHOL) 40 MG tablet Take 40 mg by mouth daily.      No results found for this or any previous visit (from the past 48 hour(s)). No results found.  Review of Systems  All other systems reviewed and are negative.   Blood pressure 115/71, pulse 61, temperature 97.6 F (36.4 C), temperature source Oral, resp. rate 12, height 5\' 4"  (1.626 m), weight 61.2 kg, SpO2 97%. Physical Exam  GENERAL: The patient is AO x3, in no acute distress. HEENT: Head is normocephalic and atraumatic. EOMI are intact. Mouth is well hydrated and without lesions. NECK: Supple. No masses LUNGS: Clear to auscultation. No presence of rhonchi/wheezing/rales. Adequate chest expansion HEART: RRR, normal s1 and s2. ABDOMEN: Soft, nontender, no guarding, no peritoneal signs, and nondistended. BS +. No masses. EXTREMITIES: Without any cyanosis, clubbing, rash, lesions or edema. NEUROLOGIC: AOx3, no focal motor deficit. SKIN: no jaundice, no rashes  Assessment/Plan Haley Caldwell is a 62 y.o. female with past medical history of anxiety, for myalgia, hyperlipidemia, sleep apnea, who presents for evaluation of rectal bleeding and constipation.  Will proceed with colonoscopy.  Dolores Frame, MD 07/11/2023, 8:12 AM

## 2023-07-11 NOTE — Transfer of Care (Signed)
Immediate Anesthesia Transfer of Care Note  Patient: Haley Caldwell  Procedure(s) Performed: COLONOSCOPY WITH PROPOFOL POLYPECTOMY  Patient Location: Endoscopy Unit  Anesthesia Type:General  Level of Consciousness: awake  Airway & Oxygen Therapy: Patient Spontanous Breathing  Post-op Assessment: Report given to RN and Post -op Vital signs reviewed and stable  Post vital signs: Reviewed and stable  Last Vitals:  Vitals Value Taken Time  BP    Temp    Pulse    Resp    SpO2      Last Pain:  Vitals:   07/11/23 0838  TempSrc:   PainSc: 2       Patients Stated Pain Goal: 5 (07/11/23 0756)  Complications: No notable events documented.

## 2023-07-11 NOTE — Anesthesia Procedure Notes (Signed)
Date/Time: 07/11/2023 8:35 AM  Performed by: Julian Reil, CRNAPre-anesthesia Checklist: Patient identified, Emergency Drugs available, Suction available and Patient being monitored Patient Re-evaluated:Patient Re-evaluated prior to induction Oxygen Delivery Method: Nasal cannula Induction Type: IV induction Placement Confirmation: positive ETCO2

## 2023-07-11 NOTE — Discharge Instructions (Signed)
You are being discharged to home.  Resume your previous diet.  We are waiting for your pathology results.  Your physician has recommended a repeat colonoscopy for surveillance based on pathology results.  Continue your present medications.

## 2023-07-11 NOTE — Op Note (Signed)
[facility] Patient Name: Haley Caldwell Procedure Date: 07/11/2023 8:29 AM MRN: [id number] Date of Birth: 03/10/1961 Attending MD: Katrinka Blazing , , [id number] CSN: [id number] Age: 62 Admit Type: Outpatient Procedure:                Colonoscopy Indications:              Rectal bleeding Providers:                Katrinka Blazing, Buel Ream. Thomasena Edis RN, RN, Elinor Parkinson Referring MD:              Medicines:                Monitored Anesthesia Care Complications:            No immediate complications. Estimated Blood Loss:     Estimated blood loss: none. Procedure:                Pre-Anesthesia Assessment:                           - Prior to the procedure, a History and Physical                            was performed, and patient medications, allergies                            and sensitivities were reviewed. The patient's                            tolerance of previous anesthesia was reviewed.                           - The risks and benefits of the procedure and the                            sedation options and risks were discussed with the                            patient. All questions were answered and informed                            consent was obtained.                           - ASA Grade Assessment: II - A patient with mild                            systemic disease.                           After obtaining informed consent, the colonoscope                            was passed under direct vision. Throughout the  procedure, the patient's blood pressure, pulse, and                            oxygen saturations were monitored continuously. The                            [id number]-HQ190L ([id number]) scope was introduced through                            the anus and advanced to the the cecum, identified                            by appendiceal orifice and ileocecal valve. The                             colonoscopy was performed without difficulty. The                            patient tolerated the procedure well. The quality                            of the bowel preparation was adequate. Scope In: 8:38:54 AM Scope Out: 9:09:58 AM Scope Withdrawal Time: 0 hours 19 minutes 21 seconds  Total Procedure Duration: 0 hours 31 minutes 4 seconds  Findings:      The perianal and digital rectal examinations were normal.      Three sessile polyps were found in the transverse colon and cecum. The       polyps were 1 to 6 mm in size. These polyps were removed with a cold       snare. Resection and retrieval were complete.      A 4 mm polyp was found in the descending colon. The polyp was sessile.       The polyp was removed with a cold snare. Resection and retrieval were       complete.      Non-bleeding internal hemorrhoids were found during retroflexion. The       hemorrhoids were small. Impression:               - Three 1 to 6 mm polyps in the transverse colon                            and in the cecum, removed with a cold snare.                            Resected and retrieved.                           - One 4 mm polyp in the descending colon, removed                            with a cold snare. Resected and retrieved.                           - Non-bleeding internal hemorrhoids. Moderate Sedation:  Per Anesthesia Care Recommendation:           - Discharge patient to home (ambulatory).                           - Resume previous diet.                           - Await pathology results.                           - Repeat colonoscopy for surveillance based on                            pathology results.                           - Continue present medications. Procedure Code(s):        --- Professional ---                           [phone], Colonoscopy, flexible; with removal of                            tumor(s), polyp(s), or other lesion(s) by snare                             technique Diagnosis Code(s):        --- Professional ---                           D12.3, Benign neoplasm of transverse colon (hepatic                            flexure or splenic flexure)                           D12.0, Benign neoplasm of cecum                           D12.4, Benign neoplasm of descending colon                           K64.8, Other hemorrhoids                           K62.5, Hemorrhage of anus and rectum CPT copyright 2022 American [employer] Association. All rights reserved. The codes documented in this report are preliminary and upon coder review may  be revised to meet current compliance requirements. Katrinka Blazing, MD Katrinka Blazing,  07/11/2023 9:15:08 AM This report has been signed electronically. Number of Addenda: 0

## 2023-07-14 LAB — SURGICAL PATHOLOGY

## 2023-07-16 ENCOUNTER — Encounter (INDEPENDENT_AMBULATORY_CARE_PROVIDER_SITE_OTHER): Payer: Self-pay | Admitting: *Deleted

## 2023-07-16 DIAGNOSIS — H50012 Monocular esotropia, left eye: Secondary | ICD-10-CM | POA: Diagnosis not present

## 2023-07-16 DIAGNOSIS — H532 Diplopia: Secondary | ICD-10-CM | POA: Diagnosis not present

## 2023-07-16 DIAGNOSIS — H5021 Vertical strabismus, right eye: Secondary | ICD-10-CM | POA: Diagnosis not present

## 2023-07-21 ENCOUNTER — Encounter (HOSPITAL_COMMUNITY): Payer: Self-pay | Admitting: Gastroenterology

## 2023-07-23 DIAGNOSIS — R52 Pain, unspecified: Secondary | ICD-10-CM | POA: Diagnosis not present

## 2023-07-23 DIAGNOSIS — M797 Fibromyalgia: Secondary | ICD-10-CM | POA: Diagnosis not present

## 2023-07-23 DIAGNOSIS — G8929 Other chronic pain: Secondary | ICD-10-CM | POA: Diagnosis not present

## 2023-08-21 DIAGNOSIS — R52 Pain, unspecified: Secondary | ICD-10-CM | POA: Diagnosis not present

## 2023-08-21 DIAGNOSIS — M797 Fibromyalgia: Secondary | ICD-10-CM | POA: Diagnosis not present

## 2023-08-21 DIAGNOSIS — Z6823 Body mass index (BMI) 23.0-23.9, adult: Secondary | ICD-10-CM | POA: Diagnosis not present

## 2023-08-22 DIAGNOSIS — H50012 Monocular esotropia, left eye: Secondary | ICD-10-CM | POA: Diagnosis not present

## 2023-08-22 DIAGNOSIS — M797 Fibromyalgia: Secondary | ICD-10-CM | POA: Diagnosis not present

## 2023-08-22 DIAGNOSIS — F419 Anxiety disorder, unspecified: Secondary | ICD-10-CM | POA: Diagnosis not present

## 2023-08-22 DIAGNOSIS — H50011 Monocular esotropia, right eye: Secondary | ICD-10-CM | POA: Diagnosis not present

## 2023-08-22 DIAGNOSIS — H5000 Unspecified esotropia: Secondary | ICD-10-CM | POA: Diagnosis not present

## 2023-08-22 DIAGNOSIS — H532 Diplopia: Secondary | ICD-10-CM | POA: Diagnosis not present

## 2023-09-25 DIAGNOSIS — L219 Seborrheic dermatitis, unspecified: Secondary | ICD-10-CM | POA: Diagnosis not present

## 2023-09-25 DIAGNOSIS — M797 Fibromyalgia: Secondary | ICD-10-CM | POA: Diagnosis not present

## 2023-09-25 DIAGNOSIS — R52 Pain, unspecified: Secondary | ICD-10-CM | POA: Diagnosis not present

## 2023-09-25 DIAGNOSIS — G8929 Other chronic pain: Secondary | ICD-10-CM | POA: Diagnosis not present

## 2023-09-25 DIAGNOSIS — Z299 Encounter for prophylactic measures, unspecified: Secondary | ICD-10-CM | POA: Diagnosis not present

## 2023-11-24 DIAGNOSIS — M797 Fibromyalgia: Secondary | ICD-10-CM | POA: Diagnosis not present

## 2023-11-24 DIAGNOSIS — G8929 Other chronic pain: Secondary | ICD-10-CM | POA: Diagnosis not present

## 2023-11-24 DIAGNOSIS — R52 Pain, unspecified: Secondary | ICD-10-CM | POA: Diagnosis not present

## 2023-11-24 DIAGNOSIS — Z299 Encounter for prophylactic measures, unspecified: Secondary | ICD-10-CM | POA: Diagnosis not present

## 2023-12-09 ENCOUNTER — Encounter (INDEPENDENT_AMBULATORY_CARE_PROVIDER_SITE_OTHER): Payer: Self-pay | Admitting: Gastroenterology

## 2023-12-24 DIAGNOSIS — M797 Fibromyalgia: Secondary | ICD-10-CM | POA: Diagnosis not present

## 2023-12-24 DIAGNOSIS — G8929 Other chronic pain: Secondary | ICD-10-CM | POA: Diagnosis not present

## 2023-12-24 DIAGNOSIS — R52 Pain, unspecified: Secondary | ICD-10-CM | POA: Diagnosis not present

## 2023-12-24 DIAGNOSIS — R350 Frequency of micturition: Secondary | ICD-10-CM | POA: Diagnosis not present

## 2023-12-24 DIAGNOSIS — Z299 Encounter for prophylactic measures, unspecified: Secondary | ICD-10-CM | POA: Diagnosis not present

## 2024-01-08 ENCOUNTER — Ambulatory Visit: Admitting: Orthopaedic Surgery

## 2024-01-08 ENCOUNTER — Other Ambulatory Visit (INDEPENDENT_AMBULATORY_CARE_PROVIDER_SITE_OTHER): Payer: Self-pay

## 2024-01-08 ENCOUNTER — Encounter: Payer: Self-pay | Admitting: Orthopaedic Surgery

## 2024-01-08 VITALS — BP 122/77 | HR 70 | Ht 64.0 in | Wt 140.0 lb

## 2024-01-08 DIAGNOSIS — M25511 Pain in right shoulder: Secondary | ICD-10-CM

## 2024-01-08 DIAGNOSIS — G8929 Other chronic pain: Secondary | ICD-10-CM

## 2024-01-08 MED ORDER — METHYLPREDNISOLONE ACETATE 40 MG/ML IJ SUSP
40.0000 mg | Freq: Once | INTRAMUSCULAR | Status: AC
  Administered 2024-01-08: 40 mg via INTRA_ARTICULAR

## 2024-01-08 NOTE — Progress Notes (Addendum)
 My right shoulder hurts.  She has previously seen Dr. Murrel Arnt for the left shoulder.  She began having pain in the right shoulder about six weeks ago.  She has no new trauma.  She has no redness or swelling.  She has underlying fibromyalgia and takes an occasional ibuprofen.  She has problems sleeping on the right shoulder and doing anything overhead.  She has no numbness.  She has no neck pain.  ROM of the right shoulder is nearly full in all positions, pain in the extremes.  NV intact.  X-rays were done of the right shoulder, reported separately.  Humeral head is well located within the glenoid area.  There is a calcific deposit shown on the AP view.  I cannot tell if this is within the joint or in front or behind the joint.  It is in the area of the central glenoid area.  No fracture is noted.  Bone quality is good.  Apex of lung is clear.  Encounter Diagnosis  Name Primary?   Chronic right shoulder pain Yes   PROCEDURE NOTE:  The patient request injection, verbal consent was obtained.  The right shoulder was prepped appropriately after time out was performed.   Sterile technique was observed and injection of 1 cc of DepoMedrol 40mg  with several cc's of plain xylocaine . Anesthesia was provided by ethyl chloride and a 20-gauge needle was used to inject the shoulder area. A posterior approach was used.  The injection was tolerated well.  A band aid dressing was applied.  The patient was advised to apply ice later today and tomorrow to the injection sight as needed.  I will see her in two weeks.  She may need MRI of the shoulder.  Call if any problem.  Precautions discussed.  Electronically Signed Pleasant Brilliant, MD 6/5/202511:12 AM

## 2024-01-08 NOTE — Addendum Note (Signed)
 Addended byArla Lab on: 01/08/2024 11:17 AM   Modules accepted: Orders

## 2024-01-21 DIAGNOSIS — M797 Fibromyalgia: Secondary | ICD-10-CM | POA: Diagnosis not present

## 2024-01-21 DIAGNOSIS — R52 Pain, unspecified: Secondary | ICD-10-CM | POA: Diagnosis not present

## 2024-01-21 DIAGNOSIS — Z6824 Body mass index (BMI) 24.0-24.9, adult: Secondary | ICD-10-CM | POA: Diagnosis not present

## 2024-01-21 DIAGNOSIS — E7800 Pure hypercholesterolemia, unspecified: Secondary | ICD-10-CM | POA: Diagnosis not present

## 2024-01-22 ENCOUNTER — Ambulatory Visit: Admitting: Orthopaedic Surgery

## 2024-01-22 ENCOUNTER — Encounter: Payer: Self-pay | Admitting: Orthopaedic Surgery

## 2024-01-22 DIAGNOSIS — M25511 Pain in right shoulder: Secondary | ICD-10-CM | POA: Diagnosis not present

## 2024-01-22 DIAGNOSIS — G8929 Other chronic pain: Secondary | ICD-10-CM

## 2024-01-22 NOTE — Progress Notes (Signed)
 That shot did not help that much.  She had very little results from the injection of the right shoulder on last visit.  She has pain anteriorly over the biceps tendon area and pain with external rotation.  NV intact.  She has no swelling or redness.  Overhead motion is tender.  She has seen her family doctor several times earlier this year complaining of pain in the right shoulder.  She has not responded to treatment.  She has done exercises at home.  I would like to get a MRI of the right shoulder.  Encounter Diagnosis  Name Primary?   Chronic right shoulder pain Yes   Return in two weeks.  Get MRI of the right shoulder.  Call if any problem.  Precautions discussed.  Electronically Signed Pleasant Brilliant, MD 6/19/20258:47 AM

## 2024-01-29 ENCOUNTER — Ambulatory Visit (HOSPITAL_COMMUNITY)
Admission: RE | Admit: 2024-01-29 | Discharge: 2024-01-29 | Disposition: A | Discharge status: Home / Self Care | Source: Ambulatory Visit | Attending: Orthopaedic Surgery | Admitting: Orthopaedic Surgery

## 2024-01-29 DIAGNOSIS — G8929 Other chronic pain: Secondary | ICD-10-CM | POA: Diagnosis not present

## 2024-01-29 DIAGNOSIS — M19011 Primary osteoarthritis, right shoulder: Secondary | ICD-10-CM | POA: Diagnosis not present

## 2024-01-29 DIAGNOSIS — M7581 Other shoulder lesions, right shoulder: Secondary | ICD-10-CM | POA: Diagnosis not present

## 2024-01-29 DIAGNOSIS — M25511 Pain in right shoulder: Secondary | ICD-10-CM | POA: Insufficient documentation

## 2024-02-05 ENCOUNTER — Ambulatory Visit: Admitting: Orthopaedic Surgery

## 2024-02-05 ENCOUNTER — Encounter: Payer: Self-pay | Admitting: Orthopaedic Surgery

## 2024-02-05 DIAGNOSIS — M7541 Impingement syndrome of right shoulder: Secondary | ICD-10-CM | POA: Diagnosis not present

## 2024-02-05 NOTE — Progress Notes (Signed)
 My shoulder is the same.  She went to MRI and it showed: IMPRESSION: Mild insertional tendinopathy of the supraspinatus and infraspinatus tendons without significant tear. Mild impingement change to the humeral head deep to the infraspinatus insertion.   Mild tendinopathy of the proximal biceps tendon.   Mild acromioclavicular osteoarthritis.   I have explained the findings to her.  I will begin PT and have her see Dr. Onesimo in [place] for further evaluation.  I have independently reviewed the MRI.    Shoulder motion on the right is good with pain in the extremes.  NV intact.  Encounter Diagnosis  Name Primary?   Impingement syndrome of right shoulder Yes   Begin PT  See Dr Onesimo.  Return in one month.  Call if any problem.  Precautions discussed.  Electronically Signed Lemond Stable, MD 7/3/20258:54 AM

## 2024-02-09 NOTE — Therapy (Signed)
 OUTPATIENT PHYSICAL THERAPY EVALUATION   Patient Name: Haley Caldwell MRN: [id number] DOB:08/31/1960, 63 y.o., female Today's Date: 02/10/2024  END OF SESSION:  PT End of Session - 02/10/24 1057     Visit Number 1    Number of Visits 12    Date for PT Re-Evaluation 03/23/24    PT Start Time 1015    PT Stop Time 1055    PT Time Calculation (min) 40 min    Activity Tolerance Patient tolerated treatment well          Past Medical History:  Diagnosis Date   Anxiety    social anxiety   Fibromyalgia    H/O degenerative disc disease    History of kidney stones    Hyperlipemia    Sleep apnea    Past Surgical History:  Procedure Laterality Date   ABDOMINAL HYSTERECTOMY     BREAST REDUCTION SURGERY     COLONOSCOPY WITH PROPOFOL  N/A 07/11/2023   Procedure: COLONOSCOPY WITH PROPOFOL ;  Surgeon: Eartha Angelia Sieving, MD;  [facility];  Service: Gastroenterology;  Laterality: N/A;  8:45AM;ASA 1   KNEE SURGERY Right    POLYPECTOMY  07/11/2023   Procedure: POLYPECTOMY;  Surgeon: Eartha Angelia Sieving, MD;  Location: [facility];  Service: Gastroenterology;;   SKIN GRAFT Right    hand   STRABISMUS SURGERY Bilateral 12/06/2016   Procedure: BILATERAL STRABISMUS REPAIR;  Surgeon: Neysa Fallow, MD;  Location: [facility];  Service: Ophthalmology;  Laterality: Bilateral;   TONSILLECTOMY     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   Esotropia 08/21/2022   History of strabismus surgery 08/21/2022   Hypotropia of left eye 08/21/2022   Impingement syndrome of left shoulder 11/30/2020    PCP: Maree Isles, MD   REFERRING PROVIDER: Brenna Lin, MD   REFERRING DIAG: M75.41 (ICD-10-CM) - Impingement syndrome of right shoulder   Rationale for Evaluation and Treatment:   Rehabiliation  THERAPY DIAG:  Chronic right shoulder pain  Muscle weakness (generalized)  ONSET DATE: February-March 2025   SUBJECTIVE:                                                                                                                                                                                           SUBJECTIVE STATEMENT: Insidious onset of Right shoulder pain that just started this spring. She denies any N/T or burning. She is right handed.   PERTINENT HISTORY:  See above PMH  PAIN:  NPRS scale: 0 at rest most of the time but then 10/10 upon using her arm.  Pain location:Deep into Right shoulder and down arm.  Pain description: constant, achy, sharp Aggravating factors: reaching, lifting, sleeping Relieving factors: rest, meds   PRECAUTIONS: ,  None  RED FLAGS: None   WEIGHT BEARING RESTRICTIONS:  No  FALLS:  Has patient fallen in last 6 months? 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  DIAGNOSTIC FINDINGS:  01/29/24 MRI IMPRESSION: Mild insertional tendinopathy of the supraspinatus and infraspinatus tendons without significant tear. Mild impingement change to the humeral head deep to the infraspinatus insertion.   Mild tendinopathy of the proximal biceps tendon.   Mild acromioclavicular osteoarthritis  PATIENT SURVEYS:  Patient-Specific Activity Scoring Scheme  0 represents "unable to perform." 10 represents "able to perform at prior level. 0 1 2 3 4 5 6 7 8 9  10 (Date and Score)   Activity Eval     1. showering 5     2. Sleeping in comfort 5     3. Reaching above shoulder 5   4.    5.    Score 5/10    Total score = sum of the activity scores/number of activities Minimum detectable change (90%CI) for average score = 2  points Minimum detectable change (90%CI) for single activity score = 3 points     EDEMA:  No  Shoulder  PALPATION: Tender to palpation and RTC insertion and proximal biceps tendon  UPPER EXTREMITY ROM: WNL for right UE     UPPER EXTREMITY MMT:  MMT Right eval Left eval  Shoulder flexion 5   Shoulder abduction 4   Shoulder adduction    Shoulder extension 3+   Shoulder internal rotation 4+   Shoulder external rotation 4   Middle trapezius 3+   Lower trapezius 3+   Elbow flexion 5   Elbow extension 5   Wrist flexion    Wrist extension    Wrist ulnar deviation    Wrist radial deviation    Wrist pronation    Wrist supination    Grip strength     (Blank rows = not tested                                                                                                                             TREATMENT DATE:  Eval HEP creation and review with demonstration and trial set preformed, see below for details Iontophoresis 4-6 hour wear home patch with 1.0CC dexamethasone  and saline to right lateral/anterior shoulder. Verbal instructions and education provided.      PATIENT EDUCATION: Education details: HEP, PT plan of care, ice, ionto instuctions Person educated: Patient Education method: Explanation, Demonstration, Verbal cues, and Handouts Education comprehension: verbalized understanding, further education recommended  HOME EXERCISE PROGRAM: Access Code: [email or web address] URL: [email or web address] Date: 02/10/2024 Prepared by: Redell Moose  Exercises - Shoulder extension with resistance - Neutral  - 2 x daily - 6 x weekly - 3 sets - 10 reps - Standing Shoulder Internal Rotation with Anchored Resistance (Mirrored)  - 2 x daily - 6 x weekly - 3 sets - 10 reps - Shoulder External Rotation with Anchored Resistance (Mirrored)  - 2 x daily - 6 x weekly - 3 sets - 10 reps - Prone Single Arm Shoulder Y  - 2 x daily - 6 x weekly - 2 sets - 10 reps - 3 sec  hold - Prone Single Arm Shoulder Horizontal Abduction with Scapular Retraction and Palm Down (Mirrored)  - 2 x daily - 6 x weekly - 2 sets - 10 reps - 5 sec hold - Prone Shoulder Extension - Single Arm  - 2 x daily - 6 x weekly - 2 sets - 10 reps - 3 sec hold  ASSESSMENT:  CLINICAL IMPRESSION: Patient referred to PT for right shoulder pain with impingement and RTC tendonopathy. Patient will benefit from skilled PT to improve overall function and to address impairments and limitations listed below.  OBJECTIVE IMPAIRMENTS: decreased activity tolerance for ADL's, decreased ROM, decreased strength, impaired flexibility, impaired UE use, and pain.  ACTIVITY LIMITATIONS:, lifting, carry, reaching,  cleaning,, showering, sleeping  PERSONAL FACTORS: see above PMH  also affecting patient's functional outcome.  REHAB POTENTIAL: Good  CLINICAL DECISION MAKING: Stable/uncomplicated  EVALUATION COMPLEXITY: Low    GOALS: Short term PT Goals Target date: 03/09/2024   Pt will be I and compliant with HEP. Baseline:  Goal status: New Pt will decrease pain by 25% overall with sleeping and reaching Baseline:10 with these Goal status: New  Long term PT goals Target date:03/23/2024  Pt will improve  strength to at least 4+/5 MMT to improve functional strength Baseline: Goal status: New Pt will improve Patient specific functional scale (PSFS) to at least 8/10 to show improved function level Baseline: Goal status: New Pt will reduce pain to overall less than 3/10 with sleeping, reaching, showering Baseline: Goal status: New  PLAN: PT FREQUENCY: 1-2 times per week   PT DURATION: 6- weeks  PLANNED INTERVENTIONS (unless contraindicated):  97110-Therapeutic exercises, 97530- Therapeutic activity, [id number]- Neuromuscular re-education, 97535- Self Care, 02859- Manual therapy, G0283- Electrical stimulation (unattended), 97016- Vasopneumatic device, [id number]- Ultrasound, and 97033- Ionotophoresis 4mg /ml  Dexamethasone   PLAN FOR NEXT SESSION: review HEP, focus on parascapular/RTC strength as tolerated. Modalaties PRN. How was ionoo? NEXT MD VISIT: 8/1/5  Redell JONELLE Moose, PT,DPT 02/10/2024, 10:59 AM

## 2024-02-10 ENCOUNTER — Ambulatory Visit: Attending: Orthopaedic Surgery | Admitting: Physical Therapy

## 2024-02-10 ENCOUNTER — Encounter: Payer: Self-pay | Admitting: Physical Therapy

## 2024-02-10 DIAGNOSIS — G8929 Other chronic pain: Secondary | ICD-10-CM | POA: Diagnosis not present

## 2024-02-10 DIAGNOSIS — M25511 Pain in right shoulder: Secondary | ICD-10-CM | POA: Diagnosis not present

## 2024-02-10 DIAGNOSIS — M7541 Impingement syndrome of right shoulder: Secondary | ICD-10-CM | POA: Diagnosis not present

## 2024-02-10 DIAGNOSIS — M6281 Muscle weakness (generalized): Secondary | ICD-10-CM | POA: Insufficient documentation

## 2024-02-17 ENCOUNTER — Encounter: Payer: Self-pay | Admitting: Physical Therapy

## 2024-02-17 ENCOUNTER — Ambulatory Visit: Admitting: Physical Therapy

## 2024-02-17 DIAGNOSIS — G8929 Other chronic pain: Secondary | ICD-10-CM

## 2024-02-17 DIAGNOSIS — M6281 Muscle weakness (generalized): Secondary | ICD-10-CM

## 2024-02-17 DIAGNOSIS — M25511 Pain in right shoulder: Secondary | ICD-10-CM | POA: Diagnosis not present

## 2024-02-17 NOTE — Therapy (Signed)
 OUTPATIENT PHYSICAL THERAPY TREATMENT   Patient Name: Haley Caldwell MRN: [id number] DOB:Apr 06, 1961, 63 y.o., female Today's Date: 02/17/2024  END OF SESSION:  PT End of Session - 02/17/24 1300     Visit Number 2    Number of Visits 12    Date for PT Re-Evaluation 03/23/24    PT Start Time 1255    PT Stop Time 1333    PT Time Calculation (min) 38 min    Activity Tolerance Patient tolerated treatment well          Past Medical History:  Diagnosis Date   Anxiety    social anxiety   Fibromyalgia    H/O degenerative disc disease    History of kidney stones    Hyperlipemia    Sleep apnea    Past Surgical History:  Procedure Laterality Date   ABDOMINAL HYSTERECTOMY     BREAST REDUCTION SURGERY     COLONOSCOPY WITH PROPOFOL  N/A 07/11/2023   Procedure: COLONOSCOPY WITH PROPOFOL ;  Surgeon: Eartha Angelia Sieving, MD;  Location: [facility];  Service: Gastroenterology;  Laterality: N/A;  8:45AM;ASA 1   KNEE SURGERY Right    POLYPECTOMY  07/11/2023   Procedure: POLYPECTOMY;  Surgeon: Eartha Angelia Sieving, MD;  Location: [facility];  Service: Gastroenterology;;   SKIN GRAFT Right    hand   STRABISMUS SURGERY Bilateral 12/06/2016   Procedure: BILATERAL STRABISMUS REPAIR;  Surgeon: Neysa Fallow, MD;  Location: [facility];  Service: Ophthalmology;  Laterality: Bilateral;   TONSILLECTOMY     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   Esotropia 08/21/2022   History of strabismus surgery 08/21/2022   Hypotropia of left eye 08/21/2022   Impingement syndrome of left shoulder 11/30/2020    PCP: Maree Isles, MD   REFERRING PROVIDER: Maree Isles, MD   REFERRING DIAG: M75.41 (ICD-10-CM) - Impingement syndrome of right shoulder   Rationale for Evaluation and Treatment:   Rehabiliation  THERAPY DIAG:  Chronic right shoulder pain  Muscle weakness (generalized)  ONSET DATE: February-March 2025   SUBJECTIVE:                                                                                                                                                                                           SUBJECTIVE STATEMENT: Not much pain upon arrival, pain is when she uses her arm. Does feel like the ionto helped some last time.  She is right handed.   PERTINENT HISTORY:  See above PMH  PAIN:  NPRS scale: 0 at rest most of the time but then 10/10 upon using her arm.  Pain location:Deep into Right shoulder and down arm.  Pain description: constant, achy, sharp Aggravating factors: reaching, lifting, sleeping Relieving factors: rest, meds   PRECAUTIONS: ,  None  RED FLAGS: None   WEIGHT BEARING RESTRICTIONS:  No  FALLS:  Has patient fallen in last 6 months? 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  DIAGNOSTIC FINDINGS:  01/29/24 MRI IMPRESSION: Mild insertional tendinopathy of the supraspinatus and infraspinatus tendons without significant tear. Mild impingement change to the humeral head deep to the infraspinatus insertion.   Mild tendinopathy of the proximal biceps tendon.   Mild acromioclavicular osteoarthritis  PATIENT SURVEYS:  Patient-Specific Activity Scoring Scheme  0 represents "unable to perform." 10 represents "able to perform at prior level. 0 1 2 3 4 5 6 7 8 9  10 (Date and Score)   Activity Eval     1. showering 5     2. Sleeping in comfort 5     3. Reaching above shoulder 5   4.    5.    Score 5/10    Total score = sum of the activity scores/number of activities Minimum detectable change (90%CI) for average  score = 2 points Minimum detectable change (90%CI) for single activity score = 3 points     EDEMA:  No  Shoulder  PALPATION: Tender to palpation and RTC insertion and proximal biceps tendon  UPPER EXTREMITY ROM: WNL for right UE     UPPER EXTREMITY MMT:  MMT Right eval Left eval  Shoulder flexion 5   Shoulder abduction 4   Shoulder adduction    Shoulder extension 3+   Shoulder internal rotation 4+   Shoulder external rotation 4   Middle trapezius 3+   Lower trapezius 3+   Elbow flexion 5   Elbow extension 5   Wrist flexion    Wrist extension    Wrist ulnar deviation    Wrist radial deviation    Wrist pronation    Wrist supination    Grip strength     (Blank rows = not tested                                                                                                                             TREATMENT DATE:  02/17/24 Therex: UBE L1 X 5 min total, switch directions half way HEP review and education Standing pball roll up wall for shoulder flexion AAROM 5 sec X 10 Prone Y 3 sec X 10 Prone I 3 sec X 10 Prone T (changed to bent elbow vs straight) to make slightly easier to reduce pain Sidelying shoulder ER 2# X 15 Sidelying shoulder abduction 2#  X 15 Supine chest press with scapular protraction 5# X 15  Theractivity Shoulder rows red 2X10 Shoulder extensions red 2X10 Shoulder ER red 2X10 Shoulder IR red 2X10 Standing horizontal abduction red 2X10  Modalities: Iontophoresis 4-6 hour wear home patch #2 with 1.0CC dexamethasone  and saline to right lateral/anterior shoulder. Verbal instructions and education provided.   Eval HEP creation and review with demonstration and trial set preformed, see below for details Iontophoresis 4-6 hour wear home patch with 1.0CC dexamethasone  and saline to right lateral/anterior shoulder. Verbal instructions and education provided.      PATIENT EDUCATION: Education details: HEP, PT plan of care, ice, ionto  instuctions Person educated: Patient Education method: Explanation, Demonstration, Verbal cues, and Handouts Education comprehension: verbalized understanding, further education recommended   HOME EXERCISE PROGRAM: Access Code: [email or web address] URL: [email or web address] Date: 02/10/2024 Prepared by: Redell Moose  Exercises - Shoulder extension with resistance - Neutral  - 2 x daily - 6 x weekly - 3 sets - 10 reps - Standing Shoulder Internal Rotation with Anchored Resistance (Mirrored)  - 2 x daily - 6 x weekly - 3 sets - 10 reps - Shoulder External Rotation with Anchored Resistance (Mirrored)  - 2 x daily - 6 x weekly - 3 sets - 10 reps - Prone Single Arm Shoulder Y  - 2 x daily - 6 x weekly - 2 sets - 10 reps - 3 sec hold - Prone Single Arm Shoulder Horizontal Abduction with Scapular Retraction and Palm Down (Mirrored)  - 2 x daily - 6 x weekly - 2 sets - 10 reps - 5 sec hold - Prone Shoulder Extension - Single Arm  - 2 x daily - 6 x weekly - 2 sets - 10 reps - 3 sec hold -Added on 02/17/24 standing horizontal abduction with band, denies needing picture of this one.   ASSESSMENT:  CLINICAL IMPRESSION: She shows good early compliance with HEP, only needed one cue to modify one to reduce pain with it. I did add standing horizontal abduction into her HEP as well. I provided another treatment of iontophoresis as she felt this helped some.   OBJECTIVE IMPAIRMENTS: decreased activity tolerance for ADL's, decreased ROM, decreased strength, impaired flexibility, impaired UE use, and pain.  ACTIVITY LIMITATIONS:, lifting, carry, reaching,  cleaning,, showering, sleeping  PERSONAL FACTORS: see above PMH  also affecting patient's functional outcome.  REHAB POTENTIAL: Good  CLINICAL DECISION MAKING: Stable/uncomplicated  EVALUATION COMPLEXITY: Low    GOALS: Short term PT Goals Target date: 03/09/2024   Pt will be I and compliant with HEP. Baseline:  Goal status: New Pt will  decrease pain by 25% overall with sleeping and reaching Baseline:10 with these Goal status: New  Long term PT goals Target date:03/23/2024  Pt will improve  strength to at least 4+/5 MMT to improve functional strength Baseline: Goal status: New Pt will improve Patient specific functional scale (PSFS) to at least 8/10 to show improved function level Baseline: Goal status: New Pt will reduce pain to overall less than 3/10 with sleeping, reaching, showering Baseline: Goal status: New  PLAN: PT FREQUENCY: 1-2 times per week   PT DURATION: 6- weeks  PLANNED INTERVENTIONS (unless contraindicated):  97110-Therapeutic exercises, 97530- Therapeutic activity, [id number]- Neuromuscular re-education, 97535- Self Care, 02859- Manual therapy, G0283- Electrical stimulation (unattended), 97016- Vasopneumatic device, [id number]- Ultrasound, and 97033- Ionotophoresis 4mg /ml Dexamethasone   PLAN FOR NEXT SESSION:  focus on parascapular/RTC strength as tolerated. Modalaties PRN. NEXT MD VISIT: 8/1/5  Redell JONELLE Moose, PT,DPT 02/17/2024, 1:31 PM

## 2024-02-20 DIAGNOSIS — M19019 Primary osteoarthritis, unspecified shoulder: Secondary | ICD-10-CM | POA: Diagnosis not present

## 2024-02-20 DIAGNOSIS — E559 Vitamin D deficiency, unspecified: Secondary | ICD-10-CM | POA: Diagnosis not present

## 2024-02-20 DIAGNOSIS — Z0000 Encounter for general adult medical examination without abnormal findings: Secondary | ICD-10-CM | POA: Diagnosis not present

## 2024-02-20 DIAGNOSIS — Z1339 Encounter for screening examination for other mental health and behavioral disorders: Secondary | ICD-10-CM | POA: Diagnosis not present

## 2024-02-20 DIAGNOSIS — Z1331 Encounter for screening for depression: Secondary | ICD-10-CM | POA: Diagnosis not present

## 2024-02-20 DIAGNOSIS — Z79899 Other long term (current) drug therapy: Secondary | ICD-10-CM | POA: Diagnosis not present

## 2024-02-20 DIAGNOSIS — R52 Pain, unspecified: Secondary | ICD-10-CM | POA: Diagnosis not present

## 2024-02-20 DIAGNOSIS — E7800 Pure hypercholesterolemia, unspecified: Secondary | ICD-10-CM | POA: Diagnosis not present

## 2024-02-20 DIAGNOSIS — R5383 Other fatigue: Secondary | ICD-10-CM | POA: Diagnosis not present

## 2024-02-20 DIAGNOSIS — Z7189 Other specified counseling: Secondary | ICD-10-CM | POA: Diagnosis not present

## 2024-02-20 DIAGNOSIS — Z299 Encounter for prophylactic measures, unspecified: Secondary | ICD-10-CM | POA: Diagnosis not present

## 2024-02-24 ENCOUNTER — Encounter: Payer: Self-pay | Admitting: Physical Therapy

## 2024-02-24 ENCOUNTER — Ambulatory Visit: Admitting: Physical Therapy

## 2024-02-24 DIAGNOSIS — M25511 Pain in right shoulder: Secondary | ICD-10-CM | POA: Diagnosis not present

## 2024-02-24 DIAGNOSIS — G8929 Other chronic pain: Secondary | ICD-10-CM

## 2024-02-24 DIAGNOSIS — M797 Fibromyalgia: Secondary | ICD-10-CM | POA: Diagnosis not present

## 2024-02-24 DIAGNOSIS — M6281 Muscle weakness (generalized): Secondary | ICD-10-CM

## 2024-02-24 DIAGNOSIS — R319 Hematuria, unspecified: Secondary | ICD-10-CM | POA: Diagnosis not present

## 2024-02-24 DIAGNOSIS — N390 Urinary tract infection, site not specified: Secondary | ICD-10-CM | POA: Diagnosis not present

## 2024-02-24 DIAGNOSIS — Z299 Encounter for prophylactic measures, unspecified: Secondary | ICD-10-CM | POA: Diagnosis not present

## 2024-02-24 DIAGNOSIS — R52 Pain, unspecified: Secondary | ICD-10-CM | POA: Diagnosis not present

## 2024-02-24 NOTE — Therapy (Signed)
 OUTPATIENT PHYSICAL THERAPY TREATMENT   Patient Name: Haley Caldwell MRN: [id number] DOB:April 16, 1961, 63 y.o., female Today's Date: 02/24/2024  END OF SESSION:  PT End of Session - 02/24/24 1345     Visit Number 3    Number of Visits 12    Date for PT Re-Evaluation 03/23/24    PT Start Time 1300    PT Stop Time 1340    PT Time Calculation (min) 40 min    Activity Tolerance Patient tolerated treatment well    Behavior During Therapy WFL for tasks assessed/performed          Past Medical History:  Diagnosis Date   Anxiety    social anxiety   Fibromyalgia    H/O degenerative disc disease    History of kidney stones    Hyperlipemia    Sleep apnea    Past Surgical History:  Procedure Laterality Date   ABDOMINAL HYSTERECTOMY     BREAST REDUCTION SURGERY     COLONOSCOPY WITH PROPOFOL  N/A 07/11/2023   Procedure: COLONOSCOPY WITH PROPOFOL ;  Surgeon: Eartha Angelia Sieving, MD;  Location: [facility];  Service: Gastroenterology;  Laterality: N/A;  8:45AM;ASA 1   KNEE SURGERY Right    POLYPECTOMY  07/11/2023   Procedure: POLYPECTOMY;  Surgeon: Eartha Angelia Sieving, MD;  Location: [facility];  Service: Gastroenterology;;   SKIN GRAFT Right    hand   STRABISMUS SURGERY Bilateral 12/06/2016   Procedure: BILATERAL STRABISMUS REPAIR;  Surgeon: Neysa Fallow, MD;  Location: [facility];  Service: Ophthalmology;  Laterality: Bilateral;   TONSILLECTOMY     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   Esotropia 08/21/2022   History of strabismus surgery 08/21/2022   Hypotropia of left eye 08/21/2022   Impingement syndrome of left shoulder 11/30/2020    PCP: Maree Isles, MD   REFERRING PROVIDER: Maree Isles, MD   REFERRING DIAG: M75.41 (ICD-10-CM) - Impingement syndrome of right  shoulder   Rationale for Evaluation and Treatment:  Rehabiliation  THERAPY DIAG:  Chronic right shoulder pain  Muscle weakness (generalized)  ONSET DATE: February-March 2025   SUBJECTIVE:                                                                                                                                                                                           SUBJECTIVE STATEMENT: Relays the pain has been had lately  She is right handed.   PERTINENT HISTORY:  See above PMH  PAIN:  NPRS scale: 7/10.  Pain location:Deep into Right shoulder and down arm.  Pain description: constant, achy, sharp Aggravating factors: reaching, lifting, sleeping Relieving factors: rest, meds   PRECAUTIONS: ,  None  RED FLAGS: None   WEIGHT BEARING RESTRICTIONS:  No  FALLS:  Has patient fallen in last 6 months? 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  DIAGNOSTIC FINDINGS:  01/29/24 MRI IMPRESSION: Mild insertional tendinopathy of the supraspinatus and infraspinatus tendons without significant tear. Mild impingement change to the humeral head deep to the infraspinatus insertion.   Mild tendinopathy of the proximal biceps tendon.   Mild acromioclavicular osteoarthritis  PATIENT SURVEYS:  Patient-Specific Activity Scoring Scheme  0 represents "unable to perform." 10 represents "able to perform at prior level. 0 1 2 3 4 5 6 7 8 9  10 (Date and Score)   Activity Eval     1. showering 5     2. Sleeping in comfort 5     3. Reaching above shoulder 5   4.    5.    Score 5/10    Total score = sum of the activity scores/number of activities Minimum detectable change (90%CI) for average score = 2 points Minimum detectable change (90%CI) for single activity  score = 3 points     EDEMA:  No  Shoulder  PALPATION: Tender to palpation and RTC insertion and proximal biceps tendon  UPPER EXTREMITY ROM: WNL for right UE     UPPER EXTREMITY MMT:  MMT Right eval Left eval  Shoulder flexion 5   Shoulder abduction 4   Shoulder adduction    Shoulder extension 3+   Shoulder internal rotation 4+   Shoulder external rotation 4   Middle trapezius 3+   Lower trapezius 3+   Elbow flexion 5   Elbow extension 5   Wrist flexion    Wrist extension    Wrist ulnar deviation    Wrist radial deviation    Wrist pronation    Wrist supination    Grip strength     (Blank rows = not tested                                                                                                                             TREATMENT DATE:  02/24/24 Trigger Point Dry Needling  Initial Treatment: Pt instructed on Dry Needling rational, procedures, and possible side effects. Pt instructed to expect mild to moderate muscle soreness later in the day and/or into the next day.  Pt instructed in methods to reduce muscle soreness. Pt instructed to continue prescribed HEP. Patient verbalized understanding of these instructions and education.   Patient Verbal Consent Given: Yes Education Handout Provided: Yes Muscles Treated: Right shoulder deltoid at RTC insertion Electrical Stimulation Performed: Yes,  Parameters: Milliamp current at frequency 2 with intensity turned up to tolerance, as well as micro amp currenty at frequency 100 with intensity turned up to tolerance Treatment Response/Outcome: good twitch response and good overall tolerance  Selfcare: discussed HEP revision to avoid the prone T exercise causing her pain. Instructions to back down on them if they are aggravating. Self care for post dry needling instructions.     02/17/24 Therex: UBE L1 X 5 min total, switch directions half way HEP review and education Standing pball roll up wall for shoulder  flexion AAROM 5 sec X 10 Prone Y 3 sec X 10 Prone I 3 sec X 10 Prone T (changed to bent elbow vs straight) to make slightly easier to reduce pain Sidelying shoulder ER 2# X 15 Sidelying shoulder abduction 2# X 15 Supine chest press with scapular protraction 5# X 15  Theractivity Shoulder rows red 2X10 Shoulder extensions red 2X10 Shoulder ER red 2X10 Shoulder IR red 2X10 Standing horizontal abduction red 2X10  Modalities: Iontophoresis 4-6 hour wear home patch #2 with 1.0CC dexamethasone  and saline to right lateral/anterior shoulder. Verbal instructions and education provided.   Eval HEP creation and review with demonstration and trial set preformed, see below for details Iontophoresis 4-6 hour wear home patch with 1.0CC dexamethasone  and saline to right lateral/anterior shoulder. Verbal instructions and education provided.      PATIENT EDUCATION: Education details: HEP, PT plan of care, ice, ionto instuctions Person educated: Patient Education method: Explanation, Demonstration, Verbal cues, and Handouts Education comprehension: verbalized understanding, further education recommended   HOME EXERCISE PROGRAM: Access Code: [email or web address] URL: [email or web address] Date: 02/10/2024 Prepared by: Redell Moose  Exercises - Shoulder extension with resistance - Neutral  - 2 x daily - 6 x weekly - 3 sets - 10 reps - Standing Shoulder Internal Rotation with Anchored Resistance (Mirrored)  - 2 x daily - 6 x weekly - 3 sets - 10 reps - Shoulder External Rotation with Anchored Resistance (Mirrored)  - 2 x daily - 6 x weekly - 3 sets - 10 reps - Prone Single Arm Shoulder Y  - 2 x daily - 6 x weekly - 2 sets - 10 reps - 3 sec hold - Prone Single Arm Shoulder Horizontal Abduction with Scapular Retraction and Palm Down (Mirrored)  - 2 x daily - 6 x weekly - 2 sets - 10 reps - 5 sec hold - Prone Shoulder Extension - Single Arm  - 2 x daily - 6 x weekly - 2 sets - 10 reps - 3 sec  hold -Added on 02/17/24 standing horizontal abduction with band, denies needing picture of this one.   ASSESSMENT:  CLINICAL IMPRESSION: She was in a lot more pain today and was interested in trying DN so this was performed today with electrical stimulation in efforts to reduce the shoulder pain she is having with activity.   OBJECTIVE IMPAIRMENTS: decreased activity tolerance for ADL's, decreased ROM, decreased strength, impaired flexibility, impaired UE use, and pain.  ACTIVITY LIMITATIONS:, lifting, carry, reaching,  cleaning,, showering, sleeping  PERSONAL FACTORS: see above PMH  also affecting patient's functional outcome.  REHAB POTENTIAL: Good  CLINICAL DECISION MAKING: Stable/uncomplicated  EVALUATION COMPLEXITY: Low    GOALS: Short term PT Goals Target date: 03/09/2024   Pt will be I and compliant with HEP. Baseline:  Goal status: New Pt will decrease pain by 25% overall with sleeping and reaching Baseline:10 with these Goal status: New  Long term PT goals Target date:03/23/2024  Pt  will improve  strength to at least 4+/5 MMT to improve functional strength Baseline: Goal status: New Pt will improve Patient specific functional scale (PSFS) to at least 8/10 to show improved function level Baseline: Goal status: New Pt will reduce pain to overall less than 3/10 with sleeping, reaching, showering Baseline: Goal status: New  PLAN: PT FREQUENCY: 1-2 times per week   PT DURATION: 6- weeks  PLANNED INTERVENTIONS (unless contraindicated):  97110-Therapeutic exercises, 97530- Therapeutic activity, [id number]- Neuromuscular re-education, 97535- Self Care, 02859- Manual therapy, G0283- Electrical stimulation (unattended), 97016- Vasopneumatic device, [id number]- Ultrasound, and 97033- Ionotophoresis 4mg /ml Dexamethasone   PLAN FOR NEXT SESSION:   How was DN from last time focus on parascapular/RTC strength as tolerated. Modalaties PRN. NEXT MD VISIT: 8/1/5  Redell JONELLE Moose,  PT,DPT 02/24/2024, 1:46 PM

## 2024-02-24 NOTE — Patient Instructions (Signed)

## 2024-03-02 ENCOUNTER — Ambulatory Visit: Admitting: Physical Therapy

## 2024-03-05 ENCOUNTER — Ambulatory Visit: Admitting: Orthopedic Surgery

## 2024-03-05 ENCOUNTER — Encounter: Payer: Self-pay | Admitting: Orthopedic Surgery

## 2024-03-05 VITALS — BP 125/85 | HR 99 | Wt 139.0 lb

## 2024-03-05 DIAGNOSIS — M25511 Pain in right shoulder: Secondary | ICD-10-CM | POA: Diagnosis not present

## 2024-03-05 DIAGNOSIS — G8929 Other chronic pain: Secondary | ICD-10-CM

## 2024-03-05 MED ORDER — METHYLPREDNISOLONE ACETATE 40 MG/ML IJ SUSP
40.0000 mg | Freq: Once | INTRAMUSCULAR | Status: AC
  Administered 2024-03-05: 40 mg via INTRA_ARTICULAR

## 2024-03-05 NOTE — Addendum Note (Signed)
 Addended by: MARCINE HUSBAND T on: 03/05/2024 10:11 AM   Modules accepted: Orders

## 2024-03-05 NOTE — Progress Notes (Signed)
 New Patient Visit  Assessment: Haley Caldwell is a 63 y.o. female with the following: 1. Chronic right shoulder pain  Plan: Haley Caldwell has pain in her right shoulder.  Insidious onset.  No specific injury.  Prior subacromial steroid injection was not effective.  We reviewed the MRI results in clinic today, which demonstrates mild tendinopathy of the supraspinatus and infraspinatus tendons, as well as the biceps tendon.  There is no obvious tear.  No role for surgery.  We discussed additional treatment options including ultrasound-guided glenohumeral joint injection.  She elected to proceed.  This was completed in clinic today.  Okay for her to continue with therapy.  In addition, she can take medicine such as ibuprofen to help with her pain.  She states understanding.  She will follow-up as needed.  Procedure note injection - Right shoulder, ultrasound guidance   Verbal consent was obtained to inject the Right shoulder, glenohumeral joint  Timeout was completed to confirm the site of injection.   Using the ultrasound, the rotator cuff tendons were identified.  The joint space was also identified. The skin was prepped with alcohol  and ethyl chloride was sprayed at the injection site.  A 21-gauge needle was used to inject 40 mg of Depo-Medrol  and 1% lidocaine  (4 cc) into the glenohumeral joint space of the Right shoulder using a posterolateral approach.  The needle was visualized entering the glenohumeral joint, and the medication was also visualized. There were no complications.  A sterile bandage was applied.   Note: In order to accurately identify the placement of the needle, ultrasound was required, to increase the accuracy, and specificity of the injection.   Follow-up: Return if symptoms worsen or fail to improve.  Subjective:  Chief Complaint  Patient presents with   Follow-up    4 week follow up per [facility] for discussion of right shoulder surgery    History of  Present Illness: Haley Caldwell is a 63 y.o. female who presents for evaluation of right shoulder pain.  She has previously been evaluated by Dr. Brenna.  She has had pain in the right shoulder for about 6 months.  No specific injury.  She had similar type pains in the left shoulder, but these resolved.  She has had a subacromial steroid injection, which she states provided no relief.  She has recently started working with therapy.  She completed 3 sessions.  She states it was making her pain worse.  She has obtained an MRI, and is here to discuss the findings.   Review of Systems: No fevers or chills No numbness or tingling No chest pain No shortness of breath No bowel or bladder dysfunction No GI distress No headaches   Medical History:  Past Medical History:  Diagnosis Date   Anxiety    social anxiety   Fibromyalgia    H/O degenerative disc disease    History of kidney stones    Hyperlipemia    Sleep apnea     Past Surgical History:  Procedure Laterality Date   ABDOMINAL HYSTERECTOMY     BREAST REDUCTION SURGERY     COLONOSCOPY WITH PROPOFOL  N/A 07/11/2023   Procedure: COLONOSCOPY WITH PROPOFOL ;  Surgeon: Eartha Angelia Sieving, MD;  [facility]: [facility];  Service: Gastroenterology;  Laterality: N/A;  8:45AM;ASA 1   KNEE SURGERY Right    POLYPECTOMY  07/11/2023   Procedure: POLYPECTOMY;  Surgeon: Eartha Angelia Sieving, MD;  [facility]: [facility];  Service: Gastroenterology;;   SKIN GRAFT Right  hand   STRABISMUS SURGERY Bilateral 12/06/2016   Procedure: BILATERAL STRABISMUS REPAIR;  Surgeon: Neysa Fallow, MD;  Location: [facility];  Service: Ophthalmology;  Laterality: Bilateral;   TONSILLECTOMY      Family History  Problem Relation Age of Onset   Brain cancer Paternal Grandfather    Social History   Tobacco Use   Smoking status: Former   Smokeless tobacco: Never  Vaping Use   Vaping status: Some Days  Substance Use Topics    Alcohol  use: No   Drug use: No    No Known Allergies  Current Meds  Medication Sig   acyclovir (ZOVIRAX) 200 MG capsule Take 200 mg by mouth daily at 6 (six) AM.   cholecalciferol (VITAMIN D3) 25 MCG (1000 UNIT) tablet Take 1,000 Units by mouth daily.   esomeprazole (NEXIUM) 20 MG capsule Take 20 mg by mouth daily at 12 noon. Take 2 capsules daily   HYDROcodone-acetaminophen (NORCO) 7.5-325 MG tablet Take 1 tablet by mouth every 6 (six) hours as needed for moderate pain (1/2 pill 4 times per day).   meloxicam (MOBIC) 15 MG tablet Take 15 mg by mouth as needed.   pravastatin (PRAVACHOL) 40 MG tablet Take 40 mg by mouth daily.    Objective: BP 125/85   Pulse 99   Wt 139 lb (63 kg)   BMI 23.86 kg/m   Physical Exam:  General: Alert and oriented. and No acute distress. Gait: Normal gait.  Right shoulder without deformity.  Tenderness palpation over the anterior shoulder.  Full range of motion, with slight restriction in internal rotation.  There is some discomfort.  Positive Jobes.  Positive O'Brien's.  Fingers are warm and well-perfused.  IMAGING: I personally reviewed images previously obtained in clinic  Right shoulder MRI  Impression  Mild insertional tendinopathy of the supraspinatus and infraspinatus tendons without significant tear. Mild impingement change to the humeral head deep to the infraspinatus insertion.   Mild tendinopathy of the proximal biceps tendon.   Mild acromioclavicular osteoarthritis.  New Medications:  No orders of the defined types were placed in this encounter.     Oneil DELENA Horde, MD  03/05/2024 9:56 AM

## 2024-03-05 NOTE — Patient Instructions (Signed)

## 2024-03-24 DIAGNOSIS — Z6823 Body mass index (BMI) 23.0-23.9, adult: Secondary | ICD-10-CM | POA: Diagnosis not present

## 2024-03-24 DIAGNOSIS — Z299 Encounter for prophylactic measures, unspecified: Secondary | ICD-10-CM | POA: Diagnosis not present

## 2024-03-24 DIAGNOSIS — R52 Pain, unspecified: Secondary | ICD-10-CM | POA: Diagnosis not present

## 2024-03-24 DIAGNOSIS — M797 Fibromyalgia: Secondary | ICD-10-CM | POA: Diagnosis not present

## 2024-03-24 DIAGNOSIS — M19019 Primary osteoarthritis, unspecified shoulder: Secondary | ICD-10-CM | POA: Diagnosis not present

## 2024-03-26 ENCOUNTER — Encounter: Payer: Self-pay | Admitting: Radiology

## 2024-04-21 DIAGNOSIS — Z299 Encounter for prophylactic measures, unspecified: Secondary | ICD-10-CM | POA: Diagnosis not present

## 2024-04-21 DIAGNOSIS — H109 Unspecified conjunctivitis: Secondary | ICD-10-CM | POA: Diagnosis not present

## 2024-04-21 DIAGNOSIS — N3941 Urge incontinence: Secondary | ICD-10-CM | POA: Diagnosis not present

## 2024-04-21 DIAGNOSIS — E8940 Asymptomatic postprocedural ovarian failure: Secondary | ICD-10-CM | POA: Diagnosis not present

## 2024-04-21 DIAGNOSIS — Z2821 Immunization not carried out because of patient refusal: Secondary | ICD-10-CM | POA: Diagnosis not present

## 2024-04-21 DIAGNOSIS — Z0000 Encounter for general adult medical examination without abnormal findings: Secondary | ICD-10-CM | POA: Diagnosis not present

## 2024-04-26 DIAGNOSIS — H1011 Acute atopic conjunctivitis, right eye: Secondary | ICD-10-CM | POA: Diagnosis not present

## 2024-05-05 ENCOUNTER — Ambulatory Visit: Admitting: Orthopedic Surgery

## 2024-05-05 DIAGNOSIS — M7541 Impingement syndrome of right shoulder: Secondary | ICD-10-CM

## 2024-05-05 DIAGNOSIS — G8929 Other chronic pain: Secondary | ICD-10-CM

## 2024-05-06 ENCOUNTER — Ambulatory Visit: Admitting: Orthopaedic Surgery

## 2024-05-06 ENCOUNTER — Encounter: Payer: Self-pay | Admitting: Orthopedic Surgery

## 2024-05-06 NOTE — Progress Notes (Signed)
 New Patient Visit  Assessment: Haley Caldwell is a 63 y.o. female with the following: 1. Chronic right shoulder pain  Plan: Haley Caldwell continues to have pain in the right shoulder.  Subacromial and intra-articular steroid injections have not provided sustained relief.  MRI demonstrates irritation of the tendons without a tear.  She may benefit from shoulder arthroscopy with subacromial decompression, but I think it is crucial to exhaust all nonoperative treatment options.  I would like her to be evaluated by Dr. Burnetta the possibility of additional ultrasound-guided injections.  Ultimately, if she does not see improvement following these injections, we will discuss surgery in more detail.  She is in agreement with this plan.  Will place the referral.  She will follow-up in clinic to see me as needed.   Follow-up: Return for Referral to Dr. Burnetta.  Subjective:  Chief Complaint  Patient presents with   Shoulder Pain    R still having pain with certain movements and even when she's still at times. Received injection 03/05/24 that helped with some things but pain in other areas are worse.     History of Present Illness: Haley Caldwell is a 63 y.o. female who returns for evaluation of right shoulder pain.  I saw her in clinic 2 months ago.  At that time, she had a glenohumeral joint injection.  She noticed moderate improvement in her symptoms.  However, she continues to have a lot of discomfort.  She notes it is a dull ache.  Medications have not been helpful.  She is growing frustrated.   Review of Systems: No fevers or chills No numbness or tingling No chest pain No shortness of breath No bowel or bladder dysfunction No GI distress No headaches    Objective: There were no vitals taken for this visit.  Physical Exam:  General: Alert and oriented. and No acute distress. Gait: Normal gait.  Right shoulder without deformity.  Tenderness palpation over the  anterior shoulder.  Full range of motion, with slight restriction in internal rotation.  There is some discomfort.  Positive Jobes.  Positive O'Brien's.  Fingers are warm and well-perfused.  IMAGING: I personally reviewed images previously obtained in clinic  Right shoulder MRI  Impression  Mild insertional tendinopathy of the supraspinatus and infraspinatus tendons without significant tear. Mild impingement change to the humeral head deep to the infraspinatus insertion.   Mild tendinopathy of the proximal biceps tendon.   Mild acromioclavicular osteoarthritis.  New Medications:  No orders of the defined types were placed in this encounter.     Oneil DELENA Horde, MD  05/06/2024 8:47 AM

## 2024-05-12 ENCOUNTER — Ambulatory Visit: Admitting: Orthopedic Surgery

## 2024-05-19 ENCOUNTER — Encounter (INDEPENDENT_AMBULATORY_CARE_PROVIDER_SITE_OTHER): Payer: Self-pay | Admitting: Gastroenterology

## 2024-05-24 DIAGNOSIS — M797 Fibromyalgia: Secondary | ICD-10-CM | POA: Diagnosis not present

## 2024-05-24 DIAGNOSIS — Z299 Encounter for prophylactic measures, unspecified: Secondary | ICD-10-CM | POA: Diagnosis not present

## 2024-05-24 DIAGNOSIS — E7800 Pure hypercholesterolemia, unspecified: Secondary | ICD-10-CM | POA: Diagnosis not present

## 2024-05-24 DIAGNOSIS — Z6823 Body mass index (BMI) 23.0-23.9, adult: Secondary | ICD-10-CM | POA: Diagnosis not present

## 2024-05-24 DIAGNOSIS — R52 Pain, unspecified: Secondary | ICD-10-CM | POA: Diagnosis not present

## 2024-05-28 DIAGNOSIS — L820 Inflamed seborrheic keratosis: Secondary | ICD-10-CM | POA: Diagnosis not present

## 2024-05-28 DIAGNOSIS — D1801 Hemangioma of skin and subcutaneous tissue: Secondary | ICD-10-CM | POA: Diagnosis not present

## 2024-05-28 DIAGNOSIS — Z85828 Personal history of other malignant neoplasm of skin: Secondary | ICD-10-CM | POA: Diagnosis not present

## 2024-05-28 DIAGNOSIS — L218 Other seborrheic dermatitis: Secondary | ICD-10-CM | POA: Diagnosis not present

## 2024-05-28 DIAGNOSIS — L578 Other skin changes due to chronic exposure to nonionizing radiation: Secondary | ICD-10-CM | POA: Diagnosis not present

## 2024-05-28 DIAGNOSIS — L821 Other seborrheic keratosis: Secondary | ICD-10-CM | POA: Diagnosis not present

## 2024-06-07 ENCOUNTER — Encounter: Payer: Self-pay | Admitting: Radiology

## 2024-06-08 ENCOUNTER — Encounter: Payer: Self-pay | Admitting: Sports Medicine

## 2024-06-08 ENCOUNTER — Other Ambulatory Visit: Payer: Self-pay

## 2024-06-08 ENCOUNTER — Other Ambulatory Visit (INDEPENDENT_AMBULATORY_CARE_PROVIDER_SITE_OTHER): Payer: Self-pay

## 2024-06-08 ENCOUNTER — Ambulatory Visit: Admitting: Sports Medicine

## 2024-06-08 DIAGNOSIS — M25511 Pain in right shoulder: Secondary | ICD-10-CM

## 2024-06-08 DIAGNOSIS — M7541 Impingement syndrome of right shoulder: Secondary | ICD-10-CM

## 2024-06-08 DIAGNOSIS — G8929 Other chronic pain: Secondary | ICD-10-CM

## 2024-06-08 DIAGNOSIS — M899 Disorder of bone, unspecified: Secondary | ICD-10-CM | POA: Diagnosis not present

## 2024-06-08 MED ORDER — METHYLPREDNISOLONE ACETATE 40 MG/ML IJ SUSP
60.0000 mg | INTRAMUSCULAR | Status: AC | PRN
  Administered 2024-06-08: 60 mg via INTRA_ARTICULAR

## 2024-06-08 MED ORDER — BUPIVACAINE HCL 0.25 % IJ SOLN
2.0000 mL | INTRAMUSCULAR | Status: AC | PRN
  Administered 2024-06-08: 2 mL via INTRA_ARTICULAR

## 2024-06-08 MED ORDER — LIDOCAINE HCL 1 % IJ SOLN
2.0000 mL | INTRAMUSCULAR | Status: AC | PRN
  Administered 2024-06-08: 2 mL

## 2024-06-08 NOTE — Progress Notes (Signed)
 Haley Caldwell - 63 y.o. female MRN [id number]  Date of birth: Jun 20, 1961  Office Visit Note: Visit Date: 06/08/2024 PCP: Maree Isles, MD Referred by: Onesimo Oneil LABOR, MD  Subjective: Chief Complaint  Patient presents with   Right Shoulder - Pain   HPI: Haley Caldwell is a pleasant 63 y.o. female who presents today for acute on chronic right shoulder pain.  She has been following with our partners in [place].  She has been dealing with shoulder pain since Feb/March of this year, no specific injury.  She has had both a subacromial joint injection as well as a glenohumeral joint injection, neither of which were significantly helpful.  He did have quite a bit of soreness following the glenohumeral joint injection, but does feel she had some relief with shoulder abduction and reaching motions with less pain.  She has no pain at rest or with the elbow at her side but does have pain with abduction/lateral movement.  She is interested in pursuing all treatment options short of surgery at this point.  She has used and does use meloxicam 15 mg daily as needed for the shoulder with only minimal relief.  She did perform a few sessions of formalized physical therapy including dry needling but this actually worsened her pain so she has stopped since.  Pertinent ROS were reviewed with the patient and found to be negative unless otherwise specified above in HPI.   Assessment & Plan: Visit Diagnoses:  1. Impingement syndrome of right shoulder   2. Chronic right shoulder pain   3. Scapular dysfunction    Plan: Impression is chronic right shoulder pain which has physical exam findings as well as MRI imaging indicative of shoulder impingement syndrome.  Given this and her pain, she has developed right shoulder scapular dyskinesia as well.  We did discuss performing 1 additional ultrasound-guided injection into the subacromial bursa at the region of her functional shoulder impingement, she is  agreeable to this. This was performed today under ultrasound guidance, patient tolerated well.  Advised on postinjection protocol.  She may use ice/heat, Tylenol or her meloxicam over the next few days for pain control.  Following this, I would like her to get started in purely scapular stabilization and scapulothoracic exercises to address her dysfunction.  We did print out a customized handout and my athletic trainer, Jinnie, did review these with her in the room today.  I would like her to hold from additional shoulder/rotator cuff specific PT until I see her back next 4-6 weeks.  I do think this will be largely beneficial for [facility], however given this and her downsloping acromion, if for some reason she is still having notable pain/difficulties with the shoulder, next step would be consideration of subacromial decompression with Dr. Onesimo.   Follow-up: 63 y.o. Return in about 5 weeks (around 07/13/2024) for R-shoulder.   Meds & Orders: No orders of the defined types were placed in this encounter.   Orders Placed This Encounter  Procedures   Large Joint Inj   XR Shoulder Right   US  Guided Needle Placement - No Linked Charges     Procedures: Large Joint Inj: R subacromial bursa on 06/08/2024 5:30 PM Indications: pain Details: 22 G 3.5 in needle, ultrasound-guided posterior approach Medications: 2 mL lidocaine  1 %; 2 mL bupivacaine  0.25 %; 60 mg methylPREDNISolone  acetate 40 MG/ML Outcome: tolerated well, no immediate complications  US -Guided Subacromial Injection, Right Shoulder  After discussion on risks/benefits/indications, informed verbal consent was obtained. A timeout  was then performed. Patient was seated on table in exam room. The patient's shoulder was prepped with chloraprep and alcohol  swabs and utilizing lateral approach with ultrasound guidance, the patient's subacromial space was injected with 2:2:1.5 mixture of lidocaine :bupivicaine:depomedrol via an in-plane approach. Patient tolerated  the procedure well without immediate complications.   Procedure, treatment alternatives, risks and benefits explained, specific risks discussed. Consent was given by the patient. Immediately prior to procedure a time out was called to verify the correct patient, procedure, equipment, support staff and site/side marked as required. Patient was prepped and draped in the usual sterile fashion.            Clinical History: No specialty comments available.  She reports that she has quit smoking. She has never used smokeless tobacco. No results for input(s): HGBA1C, LABURIC in the last 8760 hours.  Objective:    Physical Exam  Gen: Well-appearing, in no acute distress; non-toxic CV: Well-perfused. Warm.  Resp: Breathing unlabored on room air; no wheezing. Psych: Fluid speech in conversation; appropriate affect; normal thought process  Ortho Exam - Right shoulder: There is no bony tenderness, no AC joint TTP.  There is full active and passive range of motion in all directions.  There is 5/5 strength with rotator cuff testing, mild discomfort with internal/external rotation and the empty can position. + Hawkins impingement testing.  There is definite scapular dyskinesia without scapular winging on the right side compared to a normal functioning left scapula.  Imaging  XR Shoulder Right 4 view x-ray of the right shoulder including AP, Grashey, scapular Y and  axial view were ordered and reviewed by myself today.  X-rays demonstrate  humeral head well located within the glenohumeral joint.  There is no  significant arthritic change.  There is mild sclerosis over the lateral  aspect of the greater tuberosity, possibly indicative of impingement  syndrome.  There is a downsloping acromion.  No acute fracture noted.   *I did independently review the right shoulder MRI today during the visit.  Narrative & Impression  EXAM DESCRIPTION: MR SHOULDER RIGHT WO CONTRAST   CLINICAL HISTORY: 63  years, Female, pain   COMPARISON: None   TECHNIQUE: MRI of the shoulder was performed with multiplanar multi sequence imaging according to our usual protocol.   FINDINGS: Mild insertional tendinopathy of the supraspinatus and infraspinatus tendons. No significant tear. Mild impingement change deep to the infraspinatus insertion. The subscapularis and teres minor tendons are intact.   No significant joint effusion. No labral tear. Mild tendinopathy of the intra-articular biceps tendon. Mild acromioclavicular osteoarthritis.   IMPRESSION: Mild insertional tendinopathy of the supraspinatus and infraspinatus tendons without significant tear. Mild impingement change to the humeral head deep to the infraspinatus insertion.   Mild tendinopathy of the proximal biceps tendon.   Mild acromioclavicular osteoarthritis.   Electronically signed by: Reyes Frees MD 01/29/2024 09:26 AM EDT RP Workstation: [id number]    Past Medical/Family/Surgical/Social History: Medications & Allergies reviewed per EMR, new medications updated. 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   Esotropia 08/21/2022   History of strabismus surgery 08/21/2022   Hypotropia of left eye 08/21/2022   Impingement syndrome of left shoulder 11/30/2020   Past Medical History:  Diagnosis Date   Anxiety    social anxiety   Fibromyalgia    H/O degenerative disc disease  History of kidney stones    Hyperlipemia    Sleep apnea    Family History  Problem Relation Age of Onset   Brain cancer Paternal Grandfather    Past Surgical History:  Procedure Laterality Date   ABDOMINAL HYSTERECTOMY     BREAST REDUCTION SURGERY     COLONOSCOPY WITH PROPOFOL  N/A 07/11/2023   Procedure: COLONOSCOPY WITH PROPOFOL ;  Surgeon: Eartha Angelia Sieving, MD;  Location:  [facility];  Service: Gastroenterology;  Laterality: N/A;  8:45AM;ASA 1   KNEE SURGERY Right    POLYPECTOMY  07/11/2023   Procedure: POLYPECTOMY;  Surgeon: Eartha Angelia Sieving, MD;  Location: [facility];  Service: Gastroenterology;;   SKIN GRAFT Right    hand   STRABISMUS SURGERY Bilateral 12/06/2016   Procedure: BILATERAL STRABISMUS REPAIR;  Surgeon: Neysa Fallow, MD;  Location: [facility];  Service: Ophthalmology;  Laterality: Bilateral;   TONSILLECTOMY     Social History   Occupational History   Not on file  Tobacco Use   Smoking status: Former   Smokeless tobacco: Never  Vaping Use   Vaping status: Some Days  Substance and Sexual Activity   Alcohol  use: No   Drug use: No   Sexual activity: Not on file

## 2024-06-08 NOTE — Progress Notes (Signed)
 Patient says that she has had right shoulder pain since February or March with no known injury. She says that she has had two injections, the first gave her no relief, and the second seemed to give her cramps and spasms in the shoulder. She did physical therapy which did increase her pain, so she discontinued those sessions and has not continued her stretches or exercises on her own. She takes Meloxicam for the shoulder, which gives her minimal relief. She has noticed improvements with shoulder abduction since the second injection, and is able to move her arm overhead with no pain.  Patient was instructed in 10 minutes of therapeutic exercises for scapular retraction to improve strength, ROM and function according to my instructions and plan of care by a Certified Athletic Trainer during the office visit. A customized handout was provided and demonstration of proper technique shown and discussed. Patient did perform exercises and demonstrate understanding through teachback.  All questions discussed and answered.

## 2024-06-22 DIAGNOSIS — R52 Pain, unspecified: Secondary | ICD-10-CM | POA: Diagnosis not present

## 2024-06-22 DIAGNOSIS — G720 Drug-induced myopathy: Secondary | ICD-10-CM | POA: Diagnosis not present

## 2024-06-22 DIAGNOSIS — T466X5A Adverse effect of antihyperlipidemic and antiarteriosclerotic drugs, initial encounter: Secondary | ICD-10-CM | POA: Diagnosis not present

## 2024-06-22 DIAGNOSIS — M797 Fibromyalgia: Secondary | ICD-10-CM | POA: Diagnosis not present

## 2024-06-22 DIAGNOSIS — Z299 Encounter for prophylactic measures, unspecified: Secondary | ICD-10-CM | POA: Diagnosis not present

## 2024-07-14 ENCOUNTER — Ambulatory Visit: Admitting: Sports Medicine

## 2024-07-15 ENCOUNTER — Ambulatory Visit (INDEPENDENT_AMBULATORY_CARE_PROVIDER_SITE_OTHER): Admitting: Sports Medicine

## 2024-07-15 ENCOUNTER — Encounter: Payer: Self-pay | Admitting: Sports Medicine

## 2024-07-15 DIAGNOSIS — M25511 Pain in right shoulder: Secondary | ICD-10-CM | POA: Diagnosis not present

## 2024-07-15 DIAGNOSIS — M7541 Impingement syndrome of right shoulder: Secondary | ICD-10-CM | POA: Diagnosis not present

## 2024-07-15 DIAGNOSIS — G8929 Other chronic pain: Secondary | ICD-10-CM | POA: Diagnosis not present

## 2024-07-15 NOTE — Progress Notes (Signed)
 Haley Caldwell - 63 y.o. female MRN [id number]  Date of birth: [birth year]  Office Visit Note: Visit Date: 07/15/2024 PCP: Maree Isles, MD Referred by: Maree Isles, MD  Subjective: Chief Complaint  Patient presents with   Right Shoulder - Follow-up   HPI: Haley Caldwell is a pleasant 63 y.o. female who presents today for follow-up of chronic right shoulder pain.  We did perform an ultrasound-guided subacromial joint injection at the region of her impingement back on 11//25, this gave her 100% relief of her pain for almost a week, but she then had been doing a lot of physical work to help her daughter and believes that she has flared up the shoulder because of that; her pain is now both more intense and more consistent. She has not done her home exercises due to her schedule, and she lost the exercise sheet.  Using meloxicam 15 mg a few days a week.   Pertinent ROS were reviewed with the patient and found to be negative unless otherwise specified above in HPI.   Assessment & Plan: Visit Diagnoses:  1. Impingement syndrome of right shoulder   2. Chronic right shoulder pain    Plan: Impression is chronic right shoulder pain with physical exam as well as MRI/radiographic imaging indicative of shoulder impingement.  She did receive 100% relief of her pain in the short-term from at the location of her impingement, which is essentially diagnostic.  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  She is not at the point where she is interested in surgical options, but we did discuss a general overview of subacromial decompression possibly shaving down her mildly downsloping acromion.  She is interested in possibility discussing this with our shoulder surgeon here, Dr. Addie future.  I did give her a new sheet for scapular stabilization and scapulothoracic exercises for her to work on and become consistent with.  She may  continue to use her meloxicam 15 mg daily as needed.  I would like to see how she does over the 2 months with this and she will update me at that time.   Additional considerations:additional ultrasound-guided SAJ injection, referral to Dr. Addie for 2nd surgical opinion  Follow-up: No follow-ups on file.   Meds & Orders: No orders of the defined types were placed in this encounter.  No orders of the defined types were placed in this encounter.    Procedures: No procedures performed      Clinical History: No specialty comments available.  She reports that she has quit smoking. She has never used smokeless tobacco. No results for input(s): HGBA1C, LABURIC in the last 8760 hours.  Objective:    Physical Exam  Gen: Well-appearing, in no acute distress; non-toxic CV: Well-perfused. Warm.  Resp: Breathing unlabored on room air; no wheezing. Psych: Fluid speech in conversation; appropriate affect; normal thought process  Ortho Exam - Right shoulder: No redness, swelling, or effusion.  Imaging:  *Previous imaging:   XR Shoulder Right 4 view x-ray of the right shoulder including AP, Grashey, scapular Y and  axial view were ordered and reviewed by myself today.  X-rays demonstrate  humeral head well located within the glenohumeral joint.  There is no  significant arthritic change.  There is mild sclerosis over the lateral  aspect of the greater tuberosity, possibly indicative of impingement  syndrome.  There is a downsloping acromion.  No acute fracture noted.  Narrative &  Impression  EXAM DESCRIPTION: MR SHOULDER RIGHT WO CONTRAST   CLINICAL HISTORY: 62 years, Female, pain   COMPARISON: None   TECHNIQUE: MRI of the shoulder was performed with multiplanar multi sequence imaging according to our usual protocol.   FINDINGS: Mild insertional tendinopathy of the supraspinatus and infraspinatus tendons. No significant tear. Mild impingement change deep to the  infraspinatus insertion. The subscapularis and teres minor tendons are intact.   No significant joint effusion. No labral tear. Mild tendinopathy of the intra-articular biceps tendon. Mild acromioclavicular osteoarthritis.   IMPRESSION: Mild insertional tendinopathy of the supraspinatus and infraspinatus tendons without significant tear. Mild impingement change to the humeral head deep to the infraspinatus insertion.   Mild tendinopathy of the proximal biceps tendon.   Mild acromioclavicular osteoarthritis.   Electronically signed by: Reyes Frees MD 01/29/2024 09:26 AM EDT RP Workstation: [id number]    Past Medical/Family/Surgical/Social History: Medications & Allergies reviewed per EMR, new medications updated. 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   Esotropia 08/21/2022   History of strabismus surgery 08/21/2022   Hypotropia of left eye 08/21/2022   Impingement syndrome of left shoulder 11/30/2020   Past Medical History:  Diagnosis Date   Anxiety    social anxiety   Fibromyalgia    H/O degenerative disc disease    History of kidney stones    Hyperlipemia    Sleep apnea    Family History  Problem Relation Age of Onset   Brain cancer Paternal Grandfather    Past Surgical History:  Procedure Laterality Date   ABDOMINAL HYSTERECTOMY     BREAST REDUCTION SURGERY     COLONOSCOPY WITH PROPOFOL  N/A 07/11/2023   Procedure: COLONOSCOPY WITH PROPOFOL ;  Surgeon: Eartha Angelia Sieving, MD;  Location: [facility];  Service: Gastroenterology;  Laterality: N/A;  8:45AM;ASA 1   KNEE SURGERY Right    POLYPECTOMY  07/11/2023   Procedure: POLYPECTOMY;  Surgeon: Eartha Angelia Sieving, MD;  Location: [facility];  Service: Gastroenterology;;   SKIN GRAFT Right    hand   STRABISMUS SURGERY Bilateral  12/06/2016   Procedure: BILATERAL STRABISMUS REPAIR;  Surgeon: Neysa Fallow, MD;  Location: [facility];  Service: Ophthalmology;  Laterality: Bilateral;   TONSILLECTOMY     Social History   Occupational History   Not on file  Tobacco Use   Smoking status: Former   Smokeless tobacco: Never  Vaping Use   Vaping status: Some Days  Substance and Sexual Activity   Alcohol  use: No   Drug use: No   Sexual activity: Not on file

## 2024-07-15 NOTE — Progress Notes (Signed)
 Patient says that for 4-5 days after the injection she felt 100% relief. She has been doing a lot of physical work to help her daughter and believes that she has flared up the shoulder because of that; her pain is now both more intense and more consistent. She has not done her home exercises due to her schedule, and she lost the exercise sheet.
